# Patient Record
Sex: Female | Born: 1970 | Hispanic: Yes | Marital: Married | Smoking: Never smoker
Health system: Southern US, Community
[De-identification: ages and names within clinical notes are randomized; demographics above are authoritative.]

## PROBLEM LIST (undated history)

## (undated) DIAGNOSIS — M5416 Radiculopathy, lumbar region: Secondary | ICD-10-CM

## (undated) DIAGNOSIS — J45909 Unspecified asthma, uncomplicated: Secondary | ICD-10-CM

## (undated) DIAGNOSIS — N63 Unspecified lump in unspecified breast: Secondary | ICD-10-CM

## (undated) DIAGNOSIS — G5603 Carpal tunnel syndrome, bilateral upper limbs: Secondary | ICD-10-CM

## (undated) DIAGNOSIS — G549 Nerve root and plexus disorder, unspecified: Secondary | ICD-10-CM

## (undated) DIAGNOSIS — K219 Gastro-esophageal reflux disease without esophagitis: Secondary | ICD-10-CM

## (undated) DIAGNOSIS — J302 Other seasonal allergic rhinitis: Secondary | ICD-10-CM

## (undated) HISTORY — DX: Other seasonal allergic rhinitis: J30.2

---

## 2005-06-08 ENCOUNTER — Inpatient Hospital Stay: Payer: Self-pay | Admitting: Obstetrics and Gynecology

## 2010-03-02 ENCOUNTER — Ambulatory Visit: Payer: Self-pay | Admitting: Advanced Practice Midwife

## 2010-03-12 ENCOUNTER — Encounter: Payer: Self-pay | Admitting: Maternal and Fetal Medicine

## 2010-04-16 ENCOUNTER — Encounter: Payer: Self-pay | Admitting: Maternal & Fetal Medicine

## 2010-09-17 ENCOUNTER — Inpatient Hospital Stay: Payer: Self-pay | Admitting: Obstetrics and Gynecology

## 2019-03-06 ENCOUNTER — Other Ambulatory Visit: Payer: Self-pay

## 2019-03-06 ENCOUNTER — Ambulatory Visit
Admission: RE | Admit: 2019-03-06 | Discharge: 2019-03-06 | Disposition: A | Discharge status: Home / Self Care | Payer: Self-pay | Source: Ambulatory Visit | Attending: Oncology | Admitting: Oncology

## 2019-03-06 ENCOUNTER — Telehealth: Payer: Self-pay

## 2019-03-06 ENCOUNTER — Ambulatory Visit: Payer: Self-pay | Attending: Oncology

## 2019-03-06 ENCOUNTER — Encounter (INDEPENDENT_AMBULATORY_CARE_PROVIDER_SITE_OTHER): Payer: Self-pay

## 2019-03-06 VITALS — BP 124/82 | HR 96 | Temp 96.3°F | Ht 64.0 in | Wt 224.0 lb

## 2019-03-06 DIAGNOSIS — N63 Unspecified lump in unspecified breast: Secondary | ICD-10-CM

## 2019-03-06 NOTE — Progress Notes (Addendum)
  Subjective:     Patient ID: Melody Norris, female   DOB: 12-Sep-1970, 48 y.o.   MRN: [id number]  HPI   Review of Systems     Objective:   Physical Exam Chest:     Breasts:        Right: Tenderness present. No swelling, bleeding, inverted nipple, mass, nipple discharge or skin change.        Left: Mass and tenderness present.       Comments: Small cyst- like nodule left breast 9:30       Assessment:     48 year old hispanic patient presents with complaint of left breast mass x 3 years, recently becoming tender.  No prevoius mammogram.  Adin Hector interpreted exam. Patient screened, and meets BCCCP eligibility.  Patient does not have insurance, Medicare or Medicaid. Instructed patient on breast self awareness using teach back method.  Clinical breast exam reveals a small cystlike tender nodularity at 9:30 left breast.  Patient also reports bilateral npple tenderness, and hot flashes.  Explained she nmay be experiencing perimenopausal symtoms.  Risk Assessment    Risk Scores      03/06/2019   Last edited by: Rico Junker, RN   5-year risk: 0.4 %   Lifetime risk: 4.9 %            Plan:     Sent for bilateral disgnostic mammogram, and ultrasound.

## 2019-03-06 NOTE — Telephone Encounter (Signed)
TC from [place] at [place]. Recent pap records and last visit faxed to cancer center.  Patient has appt today. Aileen Fass, RN

## 2019-03-07 ENCOUNTER — Ambulatory Visit: Payer: Self-pay | Attending: Oncology

## 2019-03-07 ENCOUNTER — Other Ambulatory Visit: Payer: Self-pay

## 2019-03-07 DIAGNOSIS — Z0000 Encounter for general adult medical examination without abnormal findings: Secondary | ICD-10-CM

## 2019-03-07 DIAGNOSIS — N63 Unspecified lump in unspecified breast: Secondary | ICD-10-CM

## 2019-03-07 NOTE — Progress Notes (Signed)
  Subjective:     Patient ID: Melody Norris, female   DOB: 1971/05/16, 48 y.o.   MRN: [id number]  HPI   Review of Systems     Objective:   Physical Exam Genitourinary:    Labia:        Right: No rash, tenderness, lesion or injury.        Left: No rash, tenderness, lesion or injury.      Cervix: No cervical motion tenderness, discharge, friability, lesion, erythema, cervical bleeding or eversion.     Uterus: Not deviated, not enlarged, not fixed, not tender and no uterine prolapse.      Adnexa:        Right: No mass, tenderness or fullness.         Left: No mass, tenderness or fullness.          Assessment:  Returned for pap. Pelvic exam normal.  Mammogram from 03/06/19 [facility] results.  Right breast biopsy scheduled for 03/09/19 at 8:30.  Given appointment reminder .  Jaqui Laukaitis interpreted exam.    Plan:     Specimen collected for pap/

## 2019-03-07 NOTE — Progress Notes (Signed)
[facility] 4 mammogram results with biopsy of left breast recommendation.  Patient is aware.

## 2019-03-07 NOTE — Progress Notes (Signed)
Patient returned for pap.

## 2019-03-09 ENCOUNTER — Other Ambulatory Visit: Payer: Self-pay

## 2019-03-09 ENCOUNTER — Ambulatory Visit
Admission: RE | Admit: 2019-03-09 | Discharge: 2019-03-09 | Disposition: A | Discharge status: Home / Self Care | Payer: Self-pay | Source: Ambulatory Visit | Attending: Oncology | Admitting: Oncology

## 2019-03-09 DIAGNOSIS — N63 Unspecified lump in unspecified breast: Secondary | ICD-10-CM | POA: Insufficient documentation

## 2019-03-09 HISTORY — PX: BREAST BIOPSY: SHX20

## 2019-03-13 LAB — SURGICAL PATHOLOGY

## 2019-03-14 NOTE — Progress Notes (Signed)
Patient ID: Melody Norris, female   DOB: 1971-08-07, 48 y.o.   MRN: [id number] Phoned patient to relay appointment information with Dr. Hampton Abbot. Patient requested change to 04/03/19 at 9:00.  Mailed reminder.  JAqui Lauakitis interpreted call.

## 2019-03-16 LAB — PAP LB AND HPV HIGH-RISK: HPV, high-risk: NEGATIVE

## 2019-03-27 ENCOUNTER — Ambulatory Visit: Payer: Self-pay | Admitting: Surgery

## 2019-04-03 ENCOUNTER — Ambulatory Visit (INDEPENDENT_AMBULATORY_CARE_PROVIDER_SITE_OTHER): Payer: Self-pay | Admitting: Surgery

## 2019-04-03 ENCOUNTER — Encounter: Payer: Self-pay | Admitting: Surgery

## 2019-04-03 ENCOUNTER — Other Ambulatory Visit: Payer: Self-pay

## 2019-04-03 VITALS — BP 128/85 | HR 77 | Temp 97.7°F | Wt 226.0 lb

## 2019-04-03 DIAGNOSIS — D239 Other benign neoplasm of skin, unspecified: Secondary | ICD-10-CM

## 2019-04-03 DIAGNOSIS — N6320 Unspecified lump in the left breast, unspecified quadrant: Secondary | ICD-10-CM

## 2019-04-03 NOTE — Patient Instructions (Addendum)
Patient's surgery to be scheduled for 04/11/19 at [facility] with Dr. Hampton Abbot.  The patient is aware to have COVID-19 testing done on 04/06/19 at the [place] building drive thru ([address] [place]) between 8:00 am and 10:30 am. She is aware to isolate after, have no visitors, wash hands frequently, and avoid touching face.   The patient is aware she will need to Pre-Admit. Patient will check in at the [place] entrance due to COVID-19 restrictions and will then be escorted to the [place], [address] (first floor). Patient will be contacted once Pre-admission appointment has been arranged with date and time.   Patient aware to be NPO after midnight and have a driver.   She is aware to check in at the [place] entrance where she will be screened for the coronavirus and then sent to Same Day Surgery.   Patient aware that she may have one visitor due to COVID-19 restrictions.   The patient verbalizes understanding of the above.   The patient is aware to call the office should she have further questions.

## 2019-04-03 NOTE — Progress Notes (Signed)
04/03/2019  Reason for Visit:  Left breast mass  Referring Provider:  Al Pimple, RN  History of Present Illness: Melody Norris is a 48 y.o. female presenting for evaluation of a left breast mass.  The patient reports feeling a small mass for about 2 years on her left breast, but over the past few months has become more sensitive and causes her some pain.  She feels like it is a small ball, but it's not superficial on the skin.  Denies any redness, induration, or drainage from the area.  Reports she had a biopsy and that was very tender.  She does not do self breast exams.  Her menstrual cycle started at age 71, first preganacy at 68, and total of 5 pregnancies.  No history of breast cancer in her or in family.  Past Medical History: No past medical history on file.   Past Surgical History: None  Home Medications: Prior to Admission medications   Not on File    Allergies: No Known Allergies  Social History:  reports that she has never smoked. She has never used smokeless tobacco. She reports that she does not drink alcohol or use drugs.   Family History: No family history of breast cancer.  Review of Systems: Review of Systems  Constitutional: Negative for chills and fever.  Respiratory: Negative for shortness of breath.   Cardiovascular: Negative for chest pain.  Gastrointestinal: Negative for abdominal pain, nausea and vomiting.  Genitourinary: Negative for dysuria.  Musculoskeletal: Negative for myalgias.  Skin: Negative for rash.  Neurological: Negative for dizziness.  Psychiatric/Behavioral: Negative for depression.    Physical Exam BP 128/85   Pulse 77   Temp 97.7 F (36.5 C)   Wt 226 lb (102.5 kg)   SpO2 96%   BMI 38.79 kg/m  CONSTITUTIONAL: No acute distress HEENT:  Normocephalic, atraumatic, extraocular motion intact. NECK: Trachea is midline, and there is no jugular venous distension.  RESPIRATORY:  Lungs are clear, and breath sounds are equal  bilaterally. Normal respiratory effort without pathologic use of accessory muscles. CARDIOVASCULAR: Heart is regular without murmurs, gallops, or rubs. BREAST:  On left breast exam, there is small 0.5 cm palpable area at 9:30-10 o' clock position, about 10-11 cm from nipple, consistent with area where patient reports her pain.  It is superficial.  However, her imaging reports mention this mass being at 11 o'clock.  No palpable masses in that area and is not the area of tenderness for her.  No nipple changes, drainage, retraction.  No left axillary lymphadenopathy.  Right breast and axilla exam negative. GI: The abdomen is soft, obese, non-distended, non-tender.  MUSCULOSKELETAL:  Normal muscle strength and tone in all four extremities.  No peripheral edema or cyanosis. SKIN: Skin turgor is normal. There are no pathologic skin lesions.  NEUROLOGIC:  Motor and sensation is grossly normal.  Cranial nerves are grossly intact. PSYCH:  Alert and oriented to person, place and time. Affect is normal.  Laboratory Analysis: DIAGNOSIS:  A. LEFT BREAST, 11:00; ULTRASOUND-GUIDED NEEDLE CORE BIOPSY:  - FAVOR BENIGN ADNEXAL TUMOR; SEE COMMENT.  - EXCISION IS RECOMMENDED.   Comment:  Sections demonstrate a well-circumscribed lesion characterized by  islands and lobules of basaloid cells. Cells at the periphery of the  islands are smaller, while the central cells are larger and have  vesicular nuclei. Many of the islands contain dense eosinophilic material. Diagnostic considerations include (but are not limited to) cylindroma and eccrine spiradenoma. The case was reviewed intradepartmentally and  the consensus opinion is reflected above. Complete excision is recommended for a definitive characterization of this lesion.   Imaging: Mammogram and U/S 03/06/19: FINDINGS: Metallic skin marker was placed on the far upper left breast in the region of concern. Spot tangential view of the area shows a lobulated oval  circumscribed superficial mass. Mass measures approximately 1 cm. No additional mass is identified in the left breast. No architectural distortion or suspicious microcalcification in the left breast.  No findings to suggest malignancy in the right breast.  Mammographic images were processed with CAD.  On physical exam, there is a subtle palpable superficial lump in the left breast approximately 11 o'clock position 10-11 cm from the nipple. There is no erythema. No visible prominent skin pore.  Targeted ultrasound is performed, showing a mixed echogenicity but predominantly hypoechoic oval lobulated mass centered within the superficial breast fat lobule, in total measuring 0.9 x 0.4 x 0.7 cm. Focally, the superficial aspect of this mass extends into the dermis. There is definite color Doppler flow within the mass.  Ultrasound of the left axilla is negative for lymphadenopathy.  IMPRESSION: Palpable superficial left breast mass approximately 11 position 11 cm from the nipple. Ultrasound findings are nonspecific. Malignancy cannot be completely excluded. Other considerations could include atypical sebaceous cyst, hemangioma or other vascular malformation, or atypical lymph node.  No evidence of malignancy in the right breast.  Assessment and Plan: This is a 48 y.o. female with newly diagnosed left breast mass.  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  Discussed with the patient that this is overall a benign mass but that based on research, excision is recommended.  This is the only mass that she has felt and noticed and at this point I have less suspicion for diffuse masses that represent a genetic syndrome.    Discussed with radiology at [facility] today and it is felt that likely the mass was truly at 9:30 as one of the images says in her original imaging test.  However, further images and her biopsy images show 11 position.  For surgery purposes, we discussed  that she would need new clip marker placement and either skin marker or wire localization so I can identify the true mass location during surgery.  Discussed with the patient that we can proceed with excision of the breast mass.  This would be an outpatient procedure.  Discusses risks of bleeding, infection, injury to surrounding structures.  She is willing to proceed.  We will schedule her for 8/19 PM.  She understands she would need pre-admit testing and COVID-19 testing.  Face-to-face time spent with the patient and care providers was 60 minutes, with more than 50% of the time spent counseling, educating, and coordinating care of the patient.     [name], [facility]

## 2019-04-03 NOTE — H&P (View-Only) (Signed)
04/03/2019  Reason for Visit:  Left breast mass  Referring Provider:  Al Pimple, RN  History of Present Illness: Melody Norris is a 48 y.o. female presenting for evaluation of a left breast mass.  The patient reports feeling a small mass for about 2 years on her left breast, but over the past few months has become more sensitive and causes her some pain.  She feels like it is a small ball, but it's not superficial on the skin.  Denies any redness, induration, or drainage from the area.  Reports she had a biopsy and that was very tender.  She does not do self breast exams.  Her menstrual cycle started at age 70, first preganacy at 89, and total of 5 pregnancies.  No history of breast cancer in her or in family.  Past Medical History: No past medical history on file.   Past Surgical History: None  Home Medications: Prior to Admission medications   Not on File    Allergies: No Known Allergies  Social History:  reports that she has never smoked. She has never used smokeless tobacco. She reports that she does not drink alcohol or use drugs.   Family History: No family history of breast cancer.  Review of Systems: Review of Systems  Constitutional: Negative for chills and fever.  Respiratory: Negative for shortness of breath.   Cardiovascular: Negative for chest pain.  Gastrointestinal: Negative for abdominal pain, nausea and vomiting.  Genitourinary: Negative for dysuria.  Musculoskeletal: Negative for myalgias.  Skin: Negative for rash.  Neurological: Negative for dizziness.  Psychiatric/Behavioral: Negative for depression.    Physical Exam BP 128/85   Pulse 77   Temp 97.7 F (36.5 C)   Wt 226 lb (102.5 kg)   SpO2 96%   BMI 38.79 kg/m  CONSTITUTIONAL: No acute distress HEENT:  Normocephalic, atraumatic, extraocular motion intact. NECK: Trachea is midline, and there is no jugular venous distension.  RESPIRATORY:  Lungs are clear, and breath sounds are equal  bilaterally. Normal respiratory effort without pathologic use of accessory muscles. CARDIOVASCULAR: Heart is regular without murmurs, gallops, or rubs. BREAST:  On left breast exam, there is small 0.5 cm palpable area at 9:30-10 o' clock position, about 10-11 cm from nipple, consistent with area where patient reports her pain.  It is superficial.  However, her imaging reports mention this mass being at 11 o'clock.  No palpable masses in that area and is not the area of tenderness for her.  No nipple changes, drainage, retraction.  No left axillary lymphadenopathy.  Right breast and axilla exam negative. GI: The abdomen is soft, obese, non-distended, non-tender.  MUSCULOSKELETAL:  Normal muscle strength and tone in all four extremities.  No peripheral edema or cyanosis. SKIN: Skin turgor is normal. There are no pathologic skin lesions.  NEUROLOGIC:  Motor and sensation is grossly normal.  Cranial nerves are grossly intact. PSYCH:  Alert and oriented to person, place and time. Affect is normal.  Laboratory Analysis: DIAGNOSIS:  A. LEFT BREAST, 11:00; ULTRASOUND-GUIDED NEEDLE CORE BIOPSY:  - FAVOR BENIGN ADNEXAL TUMOR; SEE COMMENT.  - EXCISION IS RECOMMENDED.   Comment:  Sections demonstrate a well-circumscribed lesion characterized by  islands and lobules of basaloid cells. Cells at the periphery of the  islands are smaller, while the central cells are larger and have  vesicular nuclei. Many of the islands contain dense eosinophilic material. Diagnostic considerations include (but are not limited to) cylindroma and eccrine spiradenoma. The case was reviewed intradepartmentally and  the consensus opinion is reflected above. Complete excision is recommended for a definitive characterization of this lesion.   Imaging: Mammogram and U/S 03/06/19: FINDINGS: Metallic skin marker was placed on the far upper left breast in the region of concern. Spot tangential view of the area shows a lobulated oval  circumscribed superficial mass. Mass measures approximately 1 cm. No additional mass is identified in the left breast. No architectural distortion or suspicious microcalcification in the left breast.  No findings to suggest malignancy in the right breast.  Mammographic images were processed with CAD.  On physical exam, there is a subtle palpable superficial lump in the left breast approximately 11 o'clock position 10-11 cm from the nipple. There is no erythema. No visible prominent skin pore.  Targeted ultrasound is performed, showing a mixed echogenicity but predominantly hypoechoic oval lobulated mass centered within the superficial breast fat lobule, in total measuring 0.9 x 0.4 x 0.7 cm. Focally, the superficial aspect of this mass extends into the dermis. There is definite color Doppler flow within the mass.  Ultrasound of the left axilla is negative for lymphadenopathy.  IMPRESSION: Palpable superficial left breast mass approximately 11 position 11 cm from the nipple. Ultrasound findings are nonspecific. Malignancy cannot be completely excluded. Other considerations could include atypical sebaceous cyst, hemangioma or other vascular malformation, or atypical lymph node.  No evidence of malignancy in the right breast.  Assessment and Plan: This is a 48 y.o. female with newly diagnosed left breast mass.  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  Discussed with the patient that this is overall a benign mass but that based on research, excision is recommended.  This is the only mass that she has felt and noticed and at this point I have less suspicion for diffuse masses that represent a genetic syndrome.    Discussed with radiology at [facility] today and it is felt that likely the mass was truly at 9:30 as one of the images says in her original imaging test.  However, further images and her biopsy images show 11 position.  For surgery purposes, we discussed  that she would need new clip marker placement and either skin marker or wire localization so I can identify the true mass location during surgery.  Discussed with the patient that we can proceed with excision of the breast mass.  This would be an outpatient procedure.  Discusses risks of bleeding, infection, injury to surrounding structures.  She is willing to proceed.  We will schedule her for 8/19 PM.  She understands she would need pre-admit testing and COVID-19 testing.  Face-to-face time spent with the patient and care providers was 60 minutes, with more than 50% of the time spent counseling, educating, and coordinating care of the patient.     [name], [facility]

## 2019-04-04 ENCOUNTER — Telehealth: Payer: Self-pay

## 2019-04-04 ENCOUNTER — Other Ambulatory Visit: Payer: Self-pay | Admitting: Surgery

## 2019-04-04 DIAGNOSIS — N6320 Unspecified lump in the left breast, unspecified quadrant: Secondary | ICD-10-CM

## 2019-04-04 DIAGNOSIS — D239 Other benign neoplasm of skin, unspecified: Secondary | ICD-10-CM

## 2019-04-04 NOTE — Telephone Encounter (Signed)
Called and spoke with the patient's husband who speaks english. I informed him that the patient was scheduled for Pre Admit testing on 04/09/19 at 9:30 am, she will go in through the [place] entrance. She would still do her Covid-19 test on 04/06/19 at the [place] up testing site. She will arrive at the hospital the day of her surgery on 04/11/19 at 8:30 am and go in through the [facility] entrance. He will make her aware of dates, times, and instructions.

## 2019-04-06 ENCOUNTER — Other Ambulatory Visit
Admission: RE | Admit: 2019-04-06 | Discharge: 2019-04-06 | Disposition: A | Discharge status: Home / Self Care | Payer: HRSA Program | Source: Ambulatory Visit | Attending: Surgery | Admitting: Surgery

## 2019-04-06 ENCOUNTER — Other Ambulatory Visit: Payer: Self-pay

## 2019-04-06 DIAGNOSIS — Z20828 Contact with and (suspected) exposure to other viral communicable diseases: Secondary | ICD-10-CM | POA: Insufficient documentation

## 2019-04-06 DIAGNOSIS — Z01812 Encounter for preprocedural laboratory examination: Secondary | ICD-10-CM | POA: Diagnosis not present

## 2019-04-06 LAB — SARS CORONAVIRUS 2 (TAT 6-24 HRS): SARS Coronavirus 2: NEGATIVE

## 2019-04-09 ENCOUNTER — Encounter
Admission: RE | Admit: 2019-04-09 | Discharge: 2019-04-09 | Disposition: A | Discharge status: Home / Self Care | Payer: Self-pay | Source: Ambulatory Visit | Attending: Surgery | Admitting: Surgery

## 2019-04-09 ENCOUNTER — Other Ambulatory Visit: Payer: Self-pay

## 2019-04-09 DIAGNOSIS — N6320 Unspecified lump in the left breast, unspecified quadrant: Secondary | ICD-10-CM

## 2019-04-09 DIAGNOSIS — Z01812 Encounter for preprocedural laboratory examination: Secondary | ICD-10-CM | POA: Insufficient documentation

## 2019-04-09 HISTORY — DX: Gastro-esophageal reflux disease without esophagitis: K21.9

## 2019-04-09 HISTORY — DX: Unspecified lump in unspecified breast: N63.0

## 2019-04-09 HISTORY — DX: Unspecified asthma, uncomplicated: J45.909

## 2019-04-09 LAB — CBC
HCT: 42.4 % (ref 36.0–46.0)
Hemoglobin: 14 g/dL (ref 12.0–15.0)
MCH: 30 pg (ref 26.0–34.0)
MCHC: 33 g/dL (ref 30.0–36.0)
MCV: 90.8 fL (ref 80.0–100.0)
Platelets: 334 10*3/uL (ref 150–400)
RBC: 4.67 MIL/uL (ref 3.87–5.11)
RDW: 12.3 % (ref 11.5–15.5)
WBC: 10.4 10*3/uL (ref 4.0–10.5)
nRBC: 0 % (ref 0.0–0.2)

## 2019-04-09 MED ORDER — CHLORHEXIDINE GLUCONATE CLOTH 2 % EX PADS
6.0000 | MEDICATED_PAD | Freq: Once | CUTANEOUS | Status: DC
  Filled 2019-04-09: qty 6

## 2019-04-09 NOTE — Patient Instructions (Addendum)
Instrucciones Para Antes de la Ciruga   Su ciruga est programada para-(your procedure is scheduled on) 04/11/2019 Wed   mammography    Por favor llame al [phone] despus de las 12 del medioda del da anterior a su procedimiento para preguntar sobre su hora de llegada.  (Please call [phone] after 12 noon the day before your procedure to receive your arrival time.)                  Recuerde: (Remember)   No coma alimentos ni tome lquidos, incluyendo agua, despus de la medianoche del  (Do not eat food or drink liquids including water after midnight on_______________   Melody Norris medicinas en la manaa de la ciruga con un SORBITO de agua (take these meds the morning of surgery with a SIP of water)_none use inhaler if needed and bring to hospital with you.  _______________________________   Lavena Norris los dientes en la maana de la [place]. (you may brush your teeth the morning of surgery)   No use joyas, maquillaje de ojos, lpiz labial, crema para el cuerpo o esmalte de uas oscuro.  (Do not wear jewelry, eye makeup, lipstick, body lotion, or dark fingernail polish)   No puede usar desodorante. (you may wear deodorant)   Si va a ser ingresado despus de la ciruga, deje la maleta en el carro hasta que se le haya asignado una habitacin. (If you are to be admitted after surgery, leave suitcase in car until your room has been assigned.)   A los pacientes que se les d de alta el mismo da no se les permitir manejar a casa.  (Patients discharged on the day of surgery will not be allowed to drive home)   Use ropa suelta y cmoda de regreso a casa. (wear loose comfortable clothes for ride home)    Melody Norris (patient signature) ______________________________________       Your procedure is scheduled on: 04/11/2019 Wed Report to Mammography Remember: Instructions that are not followed completely may result in serious medical risk, up to  and including death, or upon the discretion of your surgeon and anesthesiologist your surgery may need to be rescheduled.    _x___ 1. Do not eat food after midnight the night before your procedure. You may drink clear liquids up to 2 hours before you are scheduled to arrive at the hospital for your procedure.  Do not drink clear liquids within 2 hours of your scheduled arrival to the hospital.  Clear liquids include  --Water or Apple juice without pulp  --Clear carbohydrate beverage such as ClearFast or Gatorade  --Black Coffee or Clear Tea (No milk, no creamers, do not add anything to                  the coffee or Tea Type 1 and type 2 diabetics should only drink water.   ____Ensure clear carbohydrate drink on the way to the hospital for bariatric patients  ____Ensure clear carbohydrate drink 3 hours before surgery.   No gum chewing or hard candies.     __x__ 2. No Alcohol for 24 hours before or after surgery.   __x__3. No Smoking or e-cigarettes for 24 prior to surgery.  Do not use any chewable tobacco products for at least 6 hour prior to surgery   ____  4. Bring all medications with you on the day of surgery if instructed.    __x__ 5. Notify your doctor if there is any change in your  medical condition     (cold, fever, infections).    x___6. On the morning of surgery brush your teeth with toothpaste and water.  You may rinse your mouth with mouth wash if you wish.  Do not swallow any toothpaste or mouthwash.   Do not wear jewelry, make-up, hairpins, clips or nail polish.  Do not wear lotions, powders, or perfumes. You may wear deodorant.  Do not shave 48 hours prior to surgery. Men may shave face and neck.  Do not bring valuables to the hospital.    [facility] is not responsible for any belongings or valuables.               Contacts, dentures or bridgework may not be worn into surgery.  Leave your suitcase in the car. After surgery it may be brought to your room.  For  patients admitted to the hospital, discharge time is determined by your                       treatment team.  _  Patients discharged the day of surgery will not be allowed to drive home.  You will need someone to drive you home and stay with you the night of your procedure.    Please read over the following fact sheets that you were given:   [facility] Preparing for Surgery and or MRSA Information   _x___ Take anti-hypertensive listed below, cardiac, seizure, asthma,     anti-reflux and psychiatric medicines. These include:  1. None  2.  3.  4.  5.  6.  ____Fleets enema or Magnesium Citrate as directed.   _x___ Use CHG Soap or sage wipes as directed on instruction sheet   __x_ Use inhalers on the day of surgery if needed and bring to hospital day of surgery  ____ Stop Metformin and Janumet 2 days prior to surgery.    ____ Take 1/2 of usual insulin dose the night before surgery and none on the morning     surgery.   _x___ Follow recommendations from Cardiologist, Pulmonologist or PCP regarding          stopping Aspirin, Coumadin, Plavix ,Eliquis, Effient, or Pradaxa, and Pletal.  X____Stop Anti-inflammatories such as Advil, Aleve, Ibuprofen, Motrin, Naproxen, Naprosyn, Goodies powders or aspirin products. OK to take Tylenol and                          Celebrex.   _x___ Stop supplements until after surgery.  But may continue Vitamin D, Vitamin B,       and multivitamin.   ____ Bring C-Pap to the hospital.

## 2019-04-11 ENCOUNTER — Other Ambulatory Visit: Payer: Self-pay | Admitting: Surgery

## 2019-04-11 ENCOUNTER — Encounter: Payer: Self-pay | Admitting: Anesthesiology

## 2019-04-11 ENCOUNTER — Other Ambulatory Visit: Payer: Self-pay

## 2019-04-11 ENCOUNTER — Ambulatory Visit: Payer: Self-pay | Admitting: Certified Registered"

## 2019-04-11 ENCOUNTER — Ambulatory Visit
Admission: RE | Admit: 2019-04-11 | Discharge: 2019-04-11 | Disposition: A | Discharge status: Home / Self Care | Payer: Self-pay | Source: Ambulatory Visit | Attending: Surgery | Admitting: Surgery

## 2019-04-11 ENCOUNTER — Encounter
Admission: RE | Disposition: A | Discharge status: Home / Self Care | Payer: Self-pay | Source: Home / Self Care | Attending: Surgery

## 2019-04-11 ENCOUNTER — Ambulatory Visit
Admission: RE | Admit: 2019-04-11 | Discharge: 2019-04-11 | Disposition: A | Discharge status: Home / Self Care | Payer: Self-pay | Attending: Surgery | Admitting: Surgery

## 2019-04-11 DIAGNOSIS — N6320 Unspecified lump in the left breast, unspecified quadrant: Secondary | ICD-10-CM

## 2019-04-11 DIAGNOSIS — K219 Gastro-esophageal reflux disease without esophagitis: Secondary | ICD-10-CM | POA: Insufficient documentation

## 2019-04-11 DIAGNOSIS — D239 Other benign neoplasm of skin, unspecified: Secondary | ICD-10-CM

## 2019-04-11 DIAGNOSIS — J45909 Unspecified asthma, uncomplicated: Secondary | ICD-10-CM | POA: Insufficient documentation

## 2019-04-11 HISTORY — PX: BREAST LUMPECTOMY WITH RADIOACTIVE SEED LOCALIZATION: SHX6424

## 2019-04-11 LAB — POCT PREGNANCY, URINE: Preg Test, Ur: NEGATIVE

## 2019-04-11 SURGERY — BREAST LUMPECTOMY WITH RADIOACTIVE SEED LOCALIZATION
Anesthesia: General | Laterality: Left

## 2019-04-11 MED ORDER — OXYCODONE HCL 5 MG PO TABS: 5.0000 mg | ORAL_TABLET | ORAL | 0 refills | Status: DC | PRN

## 2019-04-11 MED ORDER — ACETAMINOPHEN 500 MG PO TABS
1000.0000 mg | ORAL_TABLET | ORAL | Status: AC
  Administered 2019-04-11: 10:00:00 1000 mg via ORAL

## 2019-04-11 MED ORDER — PROPOFOL 10 MG/ML IV BOLUS
INTRAVENOUS | Status: DC | PRN
  Administered 2019-04-11: 100 mg via INTRAVENOUS
  Administered 2019-04-11: 50 mg via INTRAVENOUS

## 2019-04-11 MED ORDER — DEXAMETHASONE SODIUM PHOSPHATE 10 MG/ML IJ SOLN
INTRAMUSCULAR | Status: DC | PRN
  Administered 2019-04-11: 10 mg via INTRAVENOUS

## 2019-04-11 MED ORDER — GABAPENTIN 300 MG PO CAPS
300.0000 mg | ORAL_CAPSULE | ORAL | Status: AC
  Administered 2019-04-11: 300 mg via ORAL

## 2019-04-11 MED ORDER — FENTANYL CITRATE (PF) 100 MCG/2ML IJ SOLN
INTRAMUSCULAR | Status: DC | PRN
  Administered 2019-04-11 (×2): 25 ug via INTRAVENOUS
  Administered 2019-04-11: 50 ug via INTRAVENOUS

## 2019-04-11 MED ORDER — BUPIVACAINE-EPINEPHRINE 0.5% -1:200000 IJ SOLN
INTRAMUSCULAR | Status: DC | PRN
  Administered 2019-04-11: 20 mL

## 2019-04-11 MED ORDER — METHYLENE BLUE 0.5 % INJ SOLN
INTRAVENOUS | Status: AC
  Filled 2019-04-11: qty 10

## 2019-04-11 MED ORDER — ACETAMINOPHEN 10 MG/ML IV SOLN
INTRAVENOUS | Status: AC
  Filled 2019-04-11: qty 100

## 2019-04-11 MED ORDER — FAMOTIDINE 20 MG PO TABS
20.0000 mg | ORAL_TABLET | Freq: Once | ORAL | Status: AC
  Administered 2019-04-11: 10:00:00 20 mg via ORAL

## 2019-04-11 MED ORDER — OXYCODONE HCL 5 MG PO TABS: 5.0000 mg | ORAL_TABLET | Freq: Once | ORAL | Status: DC | PRN

## 2019-04-11 MED ORDER — OXYCODONE HCL 5 MG/5ML PO SOLN: 5.0000 mg | Freq: Once | ORAL | Status: DC | PRN

## 2019-04-11 MED ORDER — BUPIVACAINE-EPINEPHRINE (PF) 0.5% -1:200000 IJ SOLN
INTRAMUSCULAR | Status: AC
  Filled 2019-04-11: qty 30

## 2019-04-11 MED ORDER — CEFAZOLIN SODIUM-DEXTROSE 2-4 GM/100ML-% IV SOLN
2.0000 g | INTRAVENOUS | Status: AC
  Administered 2019-04-11: 2 g via INTRAVENOUS

## 2019-04-11 MED ORDER — ACETAMINOPHEN 10 MG/ML IV SOLN
INTRAVENOUS | Status: DC | PRN
  Administered 2019-04-11: 1000 mg via INTRAVENOUS

## 2019-04-11 MED ORDER — IBUPROFEN 600 MG PO TABS: 600.0000 mg | ORAL_TABLET | Freq: Three times a day (TID) | ORAL | 0 refills | Status: DC | PRN

## 2019-04-11 MED ORDER — FENTANYL CITRATE (PF) 100 MCG/2ML IJ SOLN: 25.0000 ug | INTRAMUSCULAR | Status: DC | PRN

## 2019-04-11 MED ORDER — ONDANSETRON HCL 4 MG/2ML IJ SOLN
INTRAMUSCULAR | Status: DC | PRN
  Administered 2019-04-11: 4 mg via INTRAVENOUS

## 2019-04-11 MED ORDER — MIDAZOLAM HCL 2 MG/2ML IJ SOLN
INTRAMUSCULAR | Status: DC | PRN
  Administered 2019-04-11: 2 mg via INTRAVENOUS

## 2019-04-11 MED ORDER — LIDOCAINE HCL (CARDIAC) PF 100 MG/5ML IV SOSY
PREFILLED_SYRINGE | INTRAVENOUS | Status: DC | PRN
  Administered 2019-04-11: 100 mg via INTRAVENOUS

## 2019-04-11 MED ORDER — SODIUM CHLORIDE FLUSH 0.9 % IV SOLN
INTRAVENOUS | Status: AC
  Filled 2019-04-11: qty 10

## 2019-04-11 MED ORDER — GLYCOPYRROLATE 0.2 MG/ML IJ SOLN
INTRAMUSCULAR | Status: DC | PRN
  Administered 2019-04-11: 0.2 mg via INTRAVENOUS

## 2019-04-11 MED ORDER — LACTATED RINGERS IV SOLN
INTRAVENOUS | Status: DC
  Administered 2019-04-11: 17:00:00 via INTRAVENOUS

## 2019-04-11 SURGICAL SUPPLY — 45 items
APPLIER CLIP 9.375 SM OPEN (CLIP)
BINDER BREAST LRG (GAUZE/BANDAGES/DRESSINGS) IMPLANT
BINDER BREAST MEDIUM (GAUZE/BANDAGES/DRESSINGS) IMPLANT
BLADE PHOTON ILLUMINATED (MISCELLANEOUS) IMPLANT
BLADE SURG 15 STRL LF DISP TIS (BLADE) ×2 IMPLANT
BLADE SURG 15 STRL SS (BLADE) ×4
CANISTER SUCT 1200ML W/VALVE (MISCELLANEOUS) ×3 IMPLANT
CHLORAPREP W/TINT 26 (MISCELLANEOUS) ×3 IMPLANT
CLIP APPLIE 9.375 SM OPEN (CLIP) IMPLANT
CNTNR SPEC 2.5X3XGRAD LEK (MISCELLANEOUS) ×1
CONT SPEC 4OZ STER OR WHT (MISCELLANEOUS) ×2
CONTAINER SPEC 2.5X3XGRAD LEK (MISCELLANEOUS) ×1 IMPLANT
COVER PROBE FLX POLY STRL (MISCELLANEOUS) ×3 IMPLANT
COVER WAND RF STERILE (DRAPES) IMPLANT
DERMABOND ADVANCED (GAUZE/BANDAGES/DRESSINGS) ×2
DERMABOND ADVANCED .7 DNX12 (GAUZE/BANDAGES/DRESSINGS) ×1 IMPLANT
DEVICE DUBIN SPECIMEN MAMMOGRA (MISCELLANEOUS) ×3 IMPLANT
DRAPE LAPAROTOMY 100X77 ABD (DRAPES) ×3 IMPLANT
DRSG GAUZE FLUFF 36X18 (GAUZE/BANDAGES/DRESSINGS) ×3 IMPLANT
ELECT CAUTERY BLADE 6.4 (BLADE) ×3 IMPLANT
ELECT REM PT RETURN 9FT ADLT (ELECTROSURGICAL) ×3
ELECTRODE REM PT RTRN 9FT ADLT (ELECTROSURGICAL) ×1 IMPLANT
GLOVE SURG SYN 7.0 (GLOVE) ×3 IMPLANT
GLOVE SURG SYN 7.5  E (GLOVE) ×2
GLOVE SURG SYN 7.5 E (GLOVE) ×1 IMPLANT
GOWN STRL REUS W/ TWL LRG LVL3 (GOWN DISPOSABLE) ×2 IMPLANT
GOWN STRL REUS W/TWL LRG LVL3 (GOWN DISPOSABLE) ×4
KIT TURNOVER KIT A (KITS) ×3 IMPLANT
LABEL OR SOLS (LABEL) ×3 IMPLANT
NEEDLE HYPO 22GX1.5 SAFETY (NEEDLE) ×3 IMPLANT
NEEDLE HYPO 25X1 1.5 SAFETY (NEEDLE) ×3 IMPLANT
PACK BASIN MINOR ARMC (MISCELLANEOUS) ×3 IMPLANT
SLEVE PROBE SENORX GAMMA FIND (MISCELLANEOUS) IMPLANT
SUT ETHILON 3-0 FS-10 30 BLK (SUTURE) ×3
SUT MNCRL 4-0 (SUTURE) ×2
SUT MNCRL 4-0 27XMFL (SUTURE) ×1
SUT SILK 3 0 SH 30 (SUTURE) ×3 IMPLANT
SUT VIC AB 3-0 SH 27 (SUTURE) ×2
SUT VIC AB 3-0 SH 27X BRD (SUTURE) ×1 IMPLANT
SUTURE EHLN 3-0 FS-10 30 BLK (SUTURE) ×1 IMPLANT
SUTURE MNCRL 4-0 27XMF (SUTURE) ×1 IMPLANT
SYR 10ML LL (SYRINGE) ×3 IMPLANT
SYR BULB IRRIG 60ML STRL (SYRINGE) ×3 IMPLANT
TAPE TRANSPORE STRL 2 31045 (GAUZE/BANDAGES/DRESSINGS) IMPLANT
WATER STERILE IRR 1000ML POUR (IV SOLUTION) ×3 IMPLANT

## 2019-04-11 NOTE — Anesthesia Procedure Notes (Signed)
Procedure Name: LMA Insertion Date/Time: 04/11/2019 4:53 PM Performed by: Doreen Salvage, CRNA Pre-anesthesia Checklist: Patient identified, Patient being monitored, Timeout performed, Emergency Drugs available and Suction available Patient Re-evaluated:Patient Re-evaluated prior to induction Oxygen Delivery Method: Circle system utilized Preoxygenation: Pre-oxygenation with 100% oxygen Induction Type: IV induction Ventilation: Mask ventilation without difficulty LMA: LMA inserted LMA Size: 4.0 Tube type: Oral Number of attempts: 1 Placement Confirmation: positive ETCO2 and breath sounds checked- equal and bilateral Tube secured with: Tape Dental Injury: Teeth and Oropharynx as per pre-operative assessment

## 2019-04-11 NOTE — Anesthesia Preprocedure Evaluation (Addendum)
Anesthesia Evaluation  Patient identified by MRN, date of birth, ID band Patient awake    Reviewed: Allergy & Precautions, H&P , NPO status , Patient's Chart, lab work & pertinent test results  History of Anesthesia Complications Negative for: history of anesthetic complications  Airway Mallampati: III  TM Distance: <3 FB Neck ROM: limited    Dental  (+) Chipped, Poor Dentition   Pulmonary neg shortness of breath, asthma ,           Cardiovascular Exercise Tolerance: Good (-) angina(-) Past MI and (-) DOE negative cardio ROS       Neuro/Psych negative neurological ROS  negative psych ROS   GI/Hepatic Neg liver ROS, GERD  Medicated and Controlled,  Endo/Other  negative endocrine ROS  Renal/GU      Musculoskeletal   Abdominal   Peds  Hematology negative hematology ROS (+)   Anesthesia Other Findings Past Medical History: No date: Asthma No date: Breast lump No date: GERD (gastroesophageal reflux disease) No date: Seasonal allergies  Past Surgical History: 03/09/2019: BREAST BIOPSY; Left     Comment:  clyindroma of skin No date: NO PAST SURGERIES     Reproductive/Obstetrics negative OB ROS                             Anesthesia Physical Anesthesia Plan  ASA: III  Anesthesia Plan: General ETT   Post-op Pain Management:    Induction: Intravenous  PONV Risk Score and Plan: Dexamethasone, Ondansetron, Midazolam and Treatment may vary due to age or medical condition  Airway Management Planned: LMA  Additional Equipment:   Intra-op Plan:   Post-operative Plan: Extubation in OR  Informed Consent: I have reviewed the patients History and Physical, chart, labs and discussed the procedure including the risks, benefits and alternatives for the proposed anesthesia with the patient or authorized representative who has indicated his/her understanding and acceptance.     Dental  Advisory Given  Plan Discussed with: Anesthesiologist, CRNA and Surgeon  Anesthesia Plan Comments: (Consent via interpreter   Patient consented for risks of anesthesia including but not limited to:  - adverse reactions to medications - damage to teeth, lips or other oral mucosa - sore throat or hoarseness - Damage to heart, brain, lungs or loss of life  Patient voiced understanding.)      Anesthesia Quick Evaluation

## 2019-04-11 NOTE — Anesthesia Postprocedure Evaluation (Signed)
Anesthesia Post Note  Patient: Melody Norris  Procedure(s) Performed: breast lumpectomy with radioactive seed localization (Left )  Patient location during evaluation: PACU Anesthesia Type: General Level of consciousness: awake and alert Pain management: pain level controlled Vital Signs Assessment: post-procedure vital signs reviewed and stable Respiratory status: spontaneous breathing, nonlabored ventilation and respiratory function stable Cardiovascular status: blood pressure returned to baseline and stable Postop Assessment: no apparent nausea or vomiting Anesthetic complications: no     Last Vitals:  Vitals:   04/11/19 1859 04/11/19 1907  BP: 136/88 130/87  Pulse: 93 95  Resp: 18 18  Temp: 36.7 C   SpO2: 95% 95%    Last Pain:  Vitals:   04/11/19 1907  TempSrc:   PainSc: [place]

## 2019-04-11 NOTE — Transfer of Care (Signed)
Immediate Anesthesia Transfer of Care Note  Patient: Melody Norris  Procedure(s) Performed: Procedure(s): breast lumpectomy with radioactive seed localization (Left)  Patient Location: PACU  Anesthesia Type:General  Level of Consciousness: sedated  Airway & Oxygen Therapy: Patient Spontanous Breathing and Patient connected to face mask oxygen  Post-op Assessment: Report given to RN and Post -op Vital signs reviewed and stable  Post vital signs: Reviewed and stable  Last Vitals:  Vitals:   04/11/19 1023 04/11/19 1813  BP: (!) 139/93 127/76  Pulse: 92 (!) 102  Resp: 18 20  Temp: 36.9 C 36.9 C  SpO2:  33%    Complications: No apparent anesthesia complications

## 2019-04-11 NOTE — Op Note (Signed)
  Procedure Date:  04/11/2019  Pre-operative Diagnosis:  Left breast mass  Post-operative Diagnosis:  Left breast mass  Procedure:  Left breast lumpectomy using ultrasound guidance.  Surgeon:  [name], MD  Anesthesia:  General endotracheal  Estimated Blood Loss:  10 ml  Specimens:  Left breast mass  Complications:  None  Indications for Procedure:  This is a 48 y.o. female who presents with a left breast mass.  The risks of bleeding, infection, injury to surrounding structures, hematoma, seroma, open wound, cosmetic deformity, and the need for further surgery were all discussed with the patient and was willing to proceed.  Description of Procedure: The patient was correctly identified in the preoperative area and brought into the operating room.  The patient was placed supine with VTE prophylaxis in place.  Appropriate time-outs were performed.  Anesthesia was induced and the patient was intubated.  Appropriate antibiotics were infused.  The left chest was prepped and draped in usual sterile fashion.  Ultrasound was used to confirm location of the mass at the marker clip placed by radiology earlier today in the left breast upper inner quadrant, at the 9:30 position.  An elliptical 4 cm  incision was made over the mass.  Cautery was used to dissect down the subcutaneous tissue and breast tissue until reaching the mass.  The mass was then cored out with appropriate margins using cautery.  The mass was oriented with 2-0 Silk sutures as short superior and long lateral.  It was sent off to mammography which confirmed clip within the specimen as well as the mass.  The wound cavity was then thoroughly irrigated and hemostasis was assured.  Local anesthetic was infiltrated into the skin and subcutaneous tissue of the cavity.  The wound was then closed in three layers with 2-0 Vicryl, 3-0 Vicryl and 4-0 Monocryl and sealed with DermaBond.  The patient was emerged from anesthesia and  extubated and brought to the recovery room for further management.  The patient tolerated the procedure well and all counts were correct at the end of the case.   [name], MD

## 2019-04-11 NOTE — Anesthesia Post-op Follow-up Note (Signed)
Anesthesia QCDR form completed.        

## 2019-04-11 NOTE — Interval H&P Note (Signed)
History and Physical Interval Note:  04/11/2019 4:32 PM  Melody Norris  has presented today for surgery, with the diagnosis of LEFT BREAST MASS.  The various methods of treatment have been discussed with the patient and family. After consideration of risks, benefits and other options for treatment, the patient has consented to  Procedure(s): EXCISONAL BREAST BIOPSY WITH NEEDLE LOCALIZATION, LEFT (Left) as a surgical intervention.  The patient's history has been reviewed, patient examined, no change in status, stable for surgery.  I have reviewed the patient's chart and labs.  Questions were answered to the patient's satisfaction.     Fernand Sorbello

## 2019-04-12 ENCOUNTER — Encounter: Payer: Self-pay | Admitting: Surgery

## 2019-04-16 LAB — SURGICAL PATHOLOGY

## 2019-04-17 ENCOUNTER — Telehealth: Payer: Self-pay | Admitting: Surgery

## 2019-04-17 NOTE — Telephone Encounter (Signed)
Telephone Triage Questions    Type of surgery?   breast lumpectomy with radioactive seed localization                     Date?           04/11/2019                   Physician?     Dr. Hampton Abbot   Pain ? Slight pain around the breast  Where and how severe, (1-10) 4-5   Any drainage? (Color) no drainage or pus,  Patient has a picture they could send in   Patient can smell  blood   Any Bright redness around the wound/ area?  Little bit of red   Please call patient and advise.

## 2019-04-17 NOTE — Telephone Encounter (Signed)
I talked with the patient, denies fever or chills. States she has noticed the redness for 2 days. She agrees for appointment for tomorrow, aware of address. 04-18-19 with Shon Millet PA at 9:45.

## 2019-04-18 ENCOUNTER — Other Ambulatory Visit: Payer: Self-pay

## 2019-04-18 ENCOUNTER — Ambulatory Visit (INDEPENDENT_AMBULATORY_CARE_PROVIDER_SITE_OTHER): Payer: Self-pay | Admitting: Physician Assistant

## 2019-04-18 ENCOUNTER — Encounter: Payer: Self-pay | Admitting: Physician Assistant

## 2019-04-18 VITALS — BP 129/80 | HR 84 | Temp 97.9°F | Wt 220.0 lb

## 2019-04-18 DIAGNOSIS — T8149XA Infection following a procedure, other surgical site, initial encounter: Secondary | ICD-10-CM

## 2019-04-18 DIAGNOSIS — D239 Other benign neoplasm of skin, unspecified: Secondary | ICD-10-CM

## 2019-04-18 DIAGNOSIS — Z09 Encounter for follow-up examination after completed treatment for conditions other than malignant neoplasm: Secondary | ICD-10-CM

## 2019-04-18 MED ORDER — AMOXICILLIN-POT CLAVULANATE 875-125 MG PO TABS: 1.0000 | ORAL_TABLET | Freq: Two times a day (BID) | ORAL | 0 refills | Status: AC

## 2019-04-18 NOTE — Progress Notes (Signed)
[facility] POST-OP OFFICE VISIT  04/18/2019  HPI: Melody Norris is a 48 y.o. female 7 days s/p left breast lumpectomy. She reports she is overall doing well. Some pain which improves with pain medications. She is concerning that the wound is more erythematous over the last few days and there is a "white spot" near the medial portion of the wound. This is worrisome to her because her cousin had the same surgery and had a gangrenous infection. No fevers or chills at home. No other issues.   Vital signs: BP 129/80   Pulse 84   Temp 97.9 F (36.6 C)   Wt 220 lb (99.8 kg)   SpO2 97%   BMI 35.51 kg/m    Physical Exam: Constitutional: Well appearing female, NAD  Left Breast: There is a 4 cm incision to the 9-10 O'clock position of the left breast which is intact with dermabond, there is slight erythema surrounding the wound, this is tender to palpation, the most later 1 cm is white in discoloration, no fluctuance, no expressible drainage.   Assessment/Plan: This is a 48 y.o. female with possible early superficial wound infection without abscess or evidence of necrotizing infection 1 week s/p left breast lumpectomy.    - As a precaution, will give 1 week of Augmentin BID  - Continue pain control as needed  - return precautions given (worsening erythema, purulent discharge, fever >101), which she verbalized understanding  - reviewed pathology; spiradenoma, negative for malignancy  - will follow up in ~10 days for re-check of wound  -- Edison Simon, PA-C [facility] 04/18/2019, 9:59 AM [phone] M-F: 7am - 4pm

## 2019-04-18 NOTE — Patient Instructions (Addendum)
We have sent in a prescription for antibiotics to your pharmacy.   We will see you back next week to look at the area.  Call us if you get any worsening discharge that smells bad, bad fever, or spreading redness.

## 2019-04-25 ENCOUNTER — Other Ambulatory Visit: Payer: Self-pay

## 2019-04-25 ENCOUNTER — Encounter: Payer: Self-pay | Admitting: Physician Assistant

## 2019-04-25 ENCOUNTER — Ambulatory Visit (INDEPENDENT_AMBULATORY_CARE_PROVIDER_SITE_OTHER): Payer: Self-pay | Admitting: Physician Assistant

## 2019-04-25 VITALS — BP 132/90 | HR 92 | Temp 97.5°F | Ht 63.0 in | Wt 224.0 lb

## 2019-04-25 DIAGNOSIS — N6320 Unspecified lump in the left breast, unspecified quadrant: Secondary | ICD-10-CM

## 2019-04-25 DIAGNOSIS — Z09 Encounter for follow-up examination after completed treatment for conditions other than malignant neoplasm: Secondary | ICD-10-CM

## 2019-04-25 DIAGNOSIS — D239 Other benign neoplasm of skin, unspecified: Secondary | ICD-10-CM

## 2019-04-25 MED ORDER — GABAPENTIN 300 MG PO CAPS: 300.0000 mg | ORAL_CAPSULE | Freq: Three times a day (TID) | ORAL | 0 refills | Status: DC

## 2019-04-25 MED ORDER — IBUPROFEN 600 MG PO TABS: 600.0000 mg | ORAL_TABLET | Freq: Four times a day (QID) | ORAL | 0 refills | Status: DC | PRN

## 2019-04-25 NOTE — Progress Notes (Signed)
[facility] POST-OP OFFICE VISIT  04/25/2019  HPI: Melody Norris is a 48 y.o. female 15 days s/p left breast lumpectomy for left breast mass with Dr Hampton Abbot. Pathology showed spiradenoma.   Today, she reports that her biggest concern is a "burning sensation" around the incision. She tried ibuprofen without significant improvement. No fever or worsening erythema. She does notice intermittent "yellow" drainage which "smells like blood." No foul smelling drainage. She has been slowly increasing activity but notes that movement makes her "feel like the incision is going to open up." No further complaints. She has completed her course of ABx for possible wound infection.   Please note interview was obtained with help of spanish medical interpretor.    Vital signs: BP 132/90   Pulse 92   Temp (!) 97.5 F (36.4 C)   Ht 5\' 3"  (1.6 m)   Wt 224 lb (101.6 kg)   LMP 04/24/2019 (Exact Date)   SpO2 96%   BMI 39.68 kg/m    Physical Exam: Constitutional: Well appearing female, NAD Left Breast: There is a 4 cm incision to the 9-10 O'clock position of the left breast which is intact with dermabond, previous erythema resolved, no drainage appreciable. No fluctuance.   Assessment/Plan: This is a 48 y.o. female 15 days s/p left breast lumpectomy.   - Pain control; refilled ibuprofen; will try course of gabapentin for "burning" pain  - Dry dressing prn  - No evidence of infection/abscess  - gradually increase in activity  - Reviewed pathology: Spiradenoma   - rtc 1-2 weeks for re-check with Dr Hampton Abbot  -- Edison Simon, PA-C [facility] 04/25/2019, 10:40 AM [phone] M-F: 7am - 4pm

## 2019-04-25 NOTE — Patient Instructions (Addendum)
May use ice/cold to the area as needed.   We have sent in a prescription for Gabapentin for you to try.  We have refilled your prescription for Ibuprofen.  We will keep you out of work for one more week.  May keep a dressing over the area while draining.   Follow up in 1-2 weeks with Dr Hampton Abbot.

## 2019-04-27 ENCOUNTER — Encounter: Payer: Self-pay | Admitting: Physician Assistant

## 2019-05-08 ENCOUNTER — Ambulatory Visit (INDEPENDENT_AMBULATORY_CARE_PROVIDER_SITE_OTHER): Payer: Self-pay | Admitting: Surgery

## 2019-05-08 ENCOUNTER — Encounter: Payer: Self-pay | Admitting: Surgery

## 2019-05-08 ENCOUNTER — Other Ambulatory Visit: Payer: Self-pay

## 2019-05-08 VITALS — BP 135/84 | HR 88 | Temp 97.9°F | Ht 63.0 in | Wt 230.0 lb

## 2019-05-08 DIAGNOSIS — Z09 Encounter for follow-up examination after completed treatment for conditions other than malignant neoplasm: Secondary | ICD-10-CM

## 2019-05-08 DIAGNOSIS — D239 Other benign neoplasm of skin, unspecified: Secondary | ICD-10-CM

## 2019-05-08 NOTE — Progress Notes (Signed)
05/08/2019  HPI: Melody Norris is a 48 y.o. female s/p left breast mass excision on 8/19, revealing cylindroma of skin.  She presents today for follow up.  She reports that on 9/10, she noticed overnight that her wound opened up.  She thinks she lied in bed in a position that put more stress on the wound.  She has noted serous drainage.  Denies any worsening pain, redness of the skin or induration, or fevers/chills.  She does note a rash around the wound which she's not sure if is related to tape or to neosporin that she applied around the wound.  Vital signs: BP 135/84   Pulse 88   Temp 97.9 F (36.6 C)   Ht 5\' 3"  (1.6 m)   Wt 230 lb (104.3 kg)   LMP 04/24/2019 (Exact Date)   SpO2 98%   BMI 40.74 kg/m    Physical Exam: Constitutional: No acute distress Skin:  Left breast incision has opened.  At it's widest point, it's about 5 mm separation.  The wound bed itself is healthy, with granulation tissue, no evidence of infection.  There is no erythema.  There is very mild macular rash around the incision.  Assessment/Plan: This is a 48 y.o. female s/p left breast mass excision.  --Discussed with patient that unfortunately the wound has opened but at this point, we have to let it heal by secondary intention.  Recommended that she do daily gauze dressing changes and change more often if she's sweating or gets dirty.  She can shower every day but not submerge the wound.  This will continue to heal.  No antibiotics needed. --Will give her work note so she can still be out of work for two more weeks as the wound heals since she's active in [employer] work. --Follow up in two weeks for wound check.   [name], [facility]

## 2019-05-08 NOTE — Patient Instructions (Signed)
May keep a dressing over the area while it is open. Change your dressing once or twice a day.   You may shower, remove the dressing first, wash and rinse well, pat dry and replace your dressing.   May use Ibuprofen or Tylenol as needed for discomfort.   We will keep you out of work until we see you again in 2 weeks.   Follow up in 2 weeks.

## 2019-05-23 ENCOUNTER — Encounter: Payer: Self-pay | Admitting: Surgery

## 2019-05-23 ENCOUNTER — Other Ambulatory Visit: Payer: Self-pay

## 2019-05-23 ENCOUNTER — Ambulatory Visit (INDEPENDENT_AMBULATORY_CARE_PROVIDER_SITE_OTHER): Payer: Self-pay | Admitting: Surgery

## 2019-05-23 VITALS — BP 118/84 | HR 86 | Temp 97.5°F | Ht 63.0 in | Wt 227.0 lb

## 2019-05-23 DIAGNOSIS — D239 Other benign neoplasm of skin, unspecified: Secondary | ICD-10-CM

## 2019-05-23 DIAGNOSIS — Z09 Encounter for follow-up examination after completed treatment for conditions other than malignant neoplasm: Secondary | ICD-10-CM

## 2019-05-23 NOTE — Patient Instructions (Addendum)
Follow up with [place] in 2 weeks with Edison Simon.   Continue dry dressing changes daily.

## 2019-05-23 NOTE — Progress Notes (Signed)
05/23/2019  HPI: Melody Norris is a 48 y.o. female s/p left breast skin cyst excision on 8/19.  Her wound opened and has been doing dry dressing changes.  Denies any worsening pain.  Reports the wound is healing but still open.  Vital signs: BP 118/84   Pulse 86   Temp (!) 97.5 F (36.4 C)   Ht 5\' 3"  (1.6 m)   Wt 227 lb (103 kg)   LMP 04/24/2019 (Exact Date)   SpO2 97%   BMI 40.21 kg/m    Physical Exam: Constitutional:  No acute distress Skin:  Left medial breast skin incision is open, with lateral edge with good healthy tissue and medial edge with fibrinous material covering the wound bed.  This was removed at bedside sharply, revealing healthy wound bed.  Dressed with dry gauze dressing.  Assessment/Plan: This is a 48 y.o. female s/p left breast skin cyst excision  --Patient is healing well.  Continue dry dressing changes daily. --No antibiotics needed at this point. --Follow up in 2 weeks.   Melody Norris, [facility]

## 2019-06-06 ENCOUNTER — Encounter: Payer: Self-pay | Admitting: Physician Assistant

## 2019-06-07 ENCOUNTER — Encounter: Payer: Self-pay | Admitting: Physician Assistant

## 2019-06-07 ENCOUNTER — Other Ambulatory Visit: Payer: Self-pay

## 2019-06-07 ENCOUNTER — Ambulatory Visit (INDEPENDENT_AMBULATORY_CARE_PROVIDER_SITE_OTHER): Payer: Self-pay | Admitting: Physician Assistant

## 2019-06-07 VITALS — BP 120/81 | HR 90 | Temp 97.9°F | Resp 14 | Ht 63.0 in | Wt 227.0 lb

## 2019-06-07 DIAGNOSIS — T8149XA Infection following a procedure, other surgical site, initial encounter: Secondary | ICD-10-CM

## 2019-06-07 DIAGNOSIS — Z09 Encounter for follow-up examination after completed treatment for conditions other than malignant neoplasm: Secondary | ICD-10-CM

## 2019-06-07 NOTE — Progress Notes (Signed)
[facility] POST-OP OFFICE VISIT  06/07/2019  HPI: Melody Norris is a 48 y.o. female ~ 2 months s/p left breast lumpectomy with Dr Hampton Abbot. She has been having issues with wound healing. Mostly had fibrinous material debrided from wound on 09/30.   Today she reports that she continues to slowly get better. No fever or chills. She is noticing intermittent drainage from the wound. No other complaints.   Vital signs: BP 120/81   Pulse 90   Temp 97.9 F (36.6 C) (Temporal)   Resp 14   Ht 5\' 3"  (1.6 m)   Wt 227 lb (103 kg)   SpO2 97%   BMI 40.21 kg/m    Physical Exam: Constitutional: Well appearing female, NAD Skin: At the 9-10 o'clock position of the medial left breast, there is a aprox. 2 x 1 cm healing by secondary intention, there is good granulation tissue in the wound bed. No surrounding erythema, no drainage.    Assessment/Plan: This is a 48 y.o. female  ~ 2 months s/p left breast lumpectomy with Dr Hampton Abbot. She has been having issues with wound healing.   - Dry dressing prn  - no indication for ABx  - work note provided  - rtc in 10 days for re-check  -- Edison Simon, PA-C [facility] 06/07/2019, 9:43 AM [phone] M-F: 7am - 4pm

## 2019-06-07 NOTE — Patient Instructions (Signed)
We applied silver nitrate, however this will cause the drainage from the wound to have a dark grayish color.  Please call our office if you have questions or concerns.

## 2019-06-13 ENCOUNTER — Ambulatory Visit: Payer: Self-pay | Admitting: Physician Assistant

## 2019-06-21 ENCOUNTER — Ambulatory Visit (INDEPENDENT_AMBULATORY_CARE_PROVIDER_SITE_OTHER): Payer: Self-pay | Admitting: Physician Assistant

## 2019-06-21 ENCOUNTER — Encounter: Payer: Self-pay | Admitting: Physician Assistant

## 2019-06-21 ENCOUNTER — Other Ambulatory Visit: Payer: Self-pay

## 2019-06-21 VITALS — BP 123/85 | HR 96 | Temp 95.5°F | Ht 63.0 in | Wt 230.0 lb

## 2019-06-21 DIAGNOSIS — Z09 Encounter for follow-up examination after completed treatment for conditions other than malignant neoplasm: Secondary | ICD-10-CM

## 2019-06-21 DIAGNOSIS — N6320 Unspecified lump in the left breast, unspecified quadrant: Secondary | ICD-10-CM

## 2019-06-21 NOTE — Patient Instructions (Signed)
Please call with any questions or concerns.

## 2019-06-21 NOTE — Progress Notes (Signed)
[facility] POST-OP OFFICE VISIT  06/21/2019  HPI: Melody Norris is a 48 y.o. female  > 2 months s/p left breast lumpectomy with Dr Hampton Abbot. She has been having issues with wound healing  Today, she reports that she is doing much better. Her incision has closed almost completely. The is a <5 mm opening at the inferior portion, intermittent serous drainage. No erythema or drainage. No fever or chills.   Vital signs: BP 123/85   Pulse 96   Temp (!) 95.5 F (35.3 C) (Temporal)   Ht 5\' 3"  (1.6 m)   Wt 230 lb (104.3 kg)   SpO2 96%   BMI 40.74 kg/m    Physical Exam: Constitutional: Well appearing female, NAD Skin: At the 9-10 o'clock position of the medial left breast, there is a well healed incision, there is only a <5 mm opening to the inferior portion, no surrounding erythema or drainage.     Assessment/Plan: This is a 48 y.o. female > 2 months s/p left breast lumpectomy   - dry dressings prn  - discussed wound will likely be closed in the next week  - pain control prn  - advised to call with questions or concerns  - follow up prn  -- Edison Simon, PA-C [facility] 06/21/2019, 11:47 AM [phone] M-F: 7am - 4pm

## 2019-06-29 NOTE — Progress Notes (Signed)
Opened in Error.

## 2019-06-29 NOTE — Progress Notes (Signed)
Letter mailed to patient with normal pap results/ negative HPV.  Next pap due in 5 years.

## 2019-07-02 NOTE — Progress Notes (Signed)
Letter mailed to notify patient of normal pap results. Copy to [facility].

## 2019-10-28 IMAGING — MG MM CLIP PLACEMENT
4 series · 4 of 12 positions shown · non-contrast
Comparison: Previous exam(s).

CLINICAL DATA: Post biopsy mammogram of the left breast for clip
placement.

EXAM:
DIAGNOSTIC LEFT MAMMOGRAM POST ULTRASOUND CLIP PLACEMENT

[L CV synth-2D (1 of 2)]
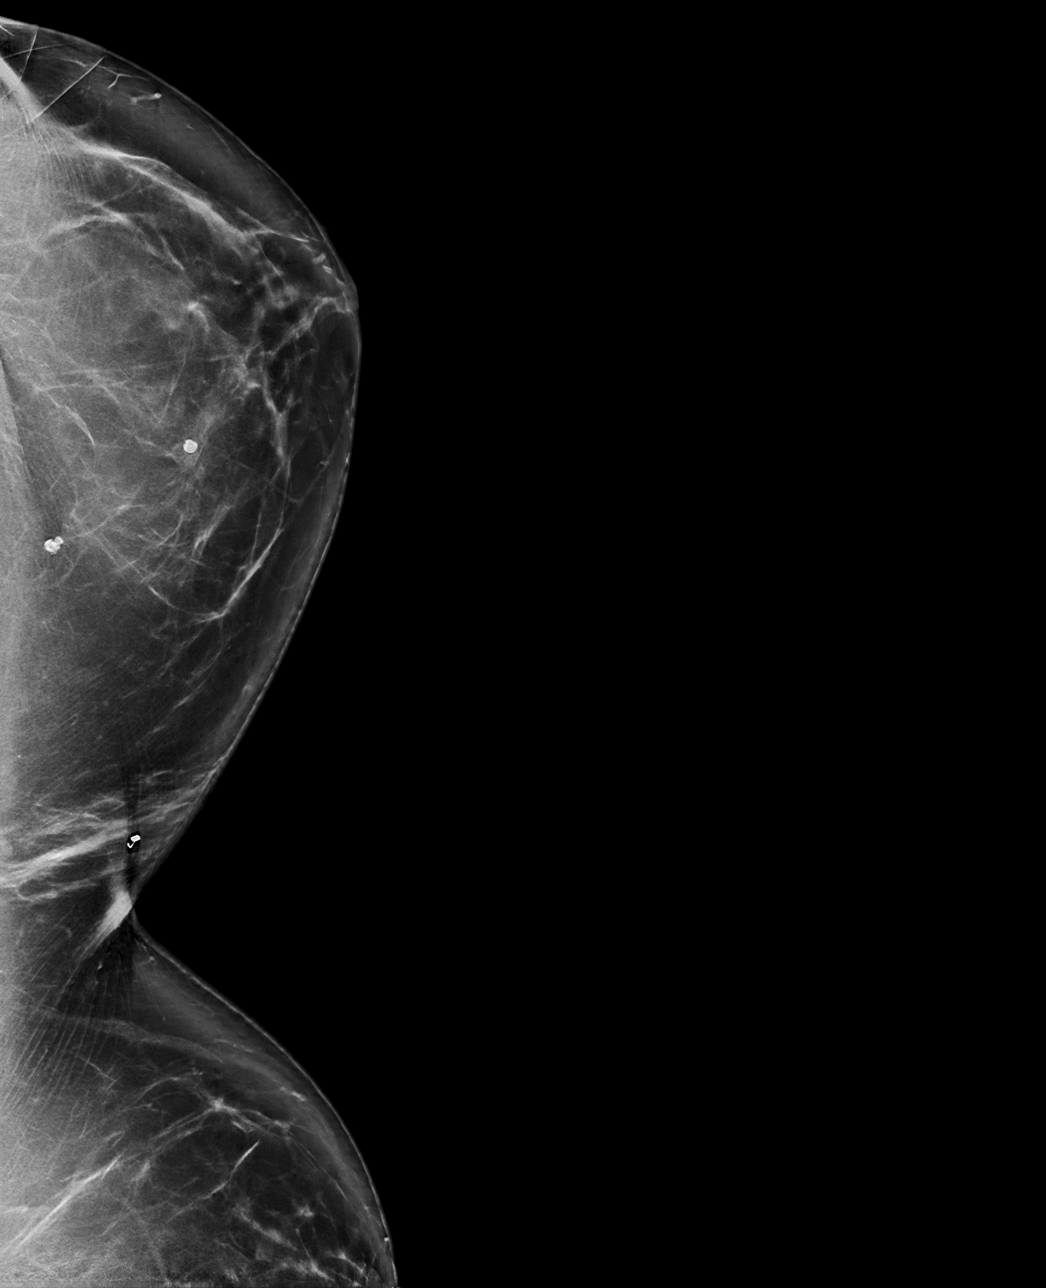

[L CV synth-2D (2 of 2)]
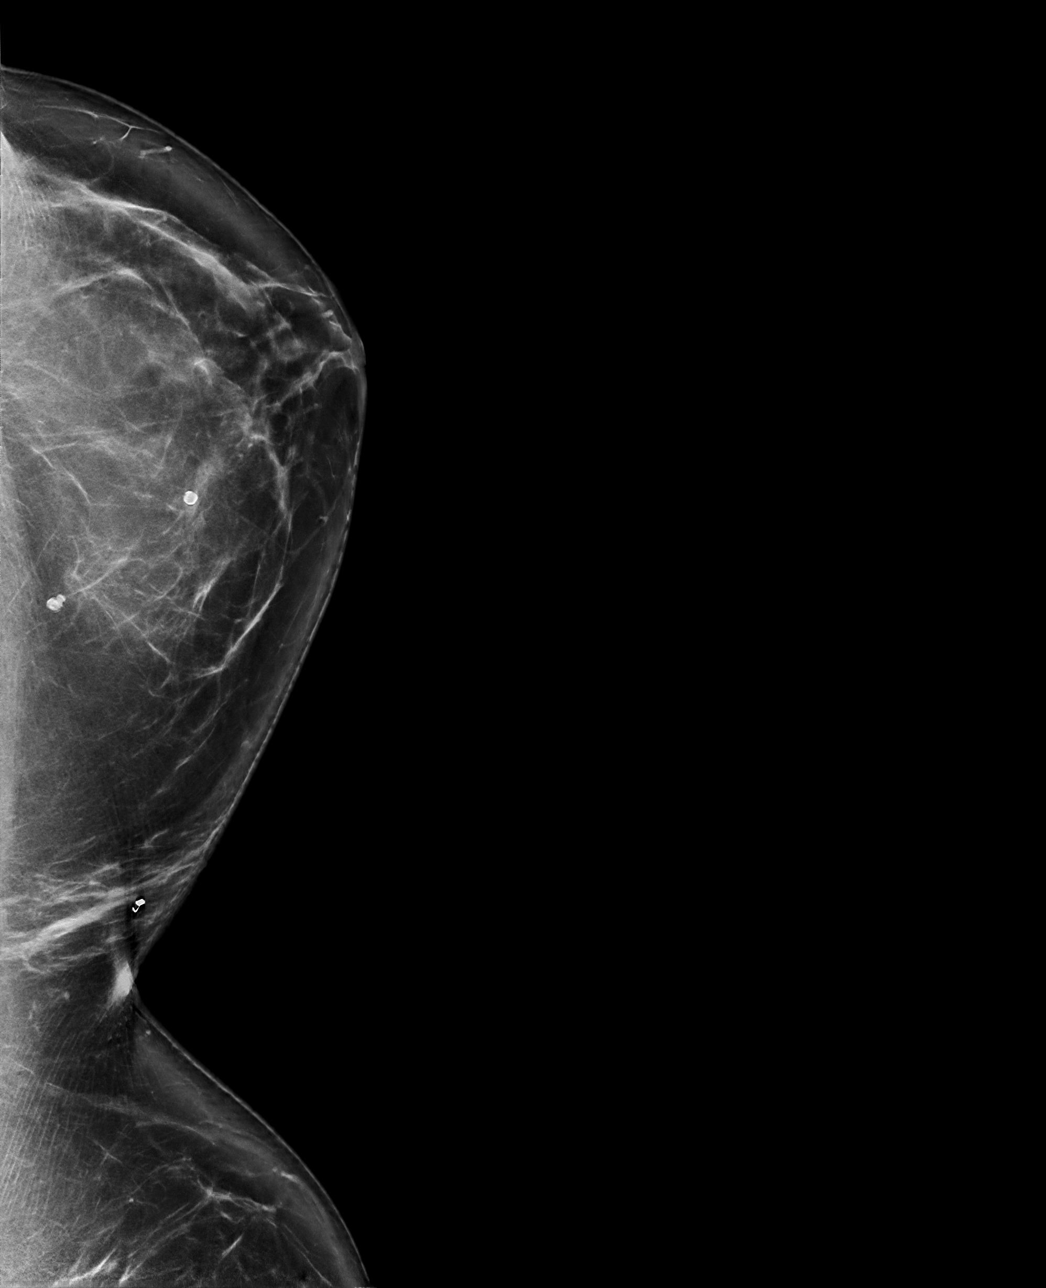

[L CV tomo (1 of 2) · tomo slice 47/93.0]
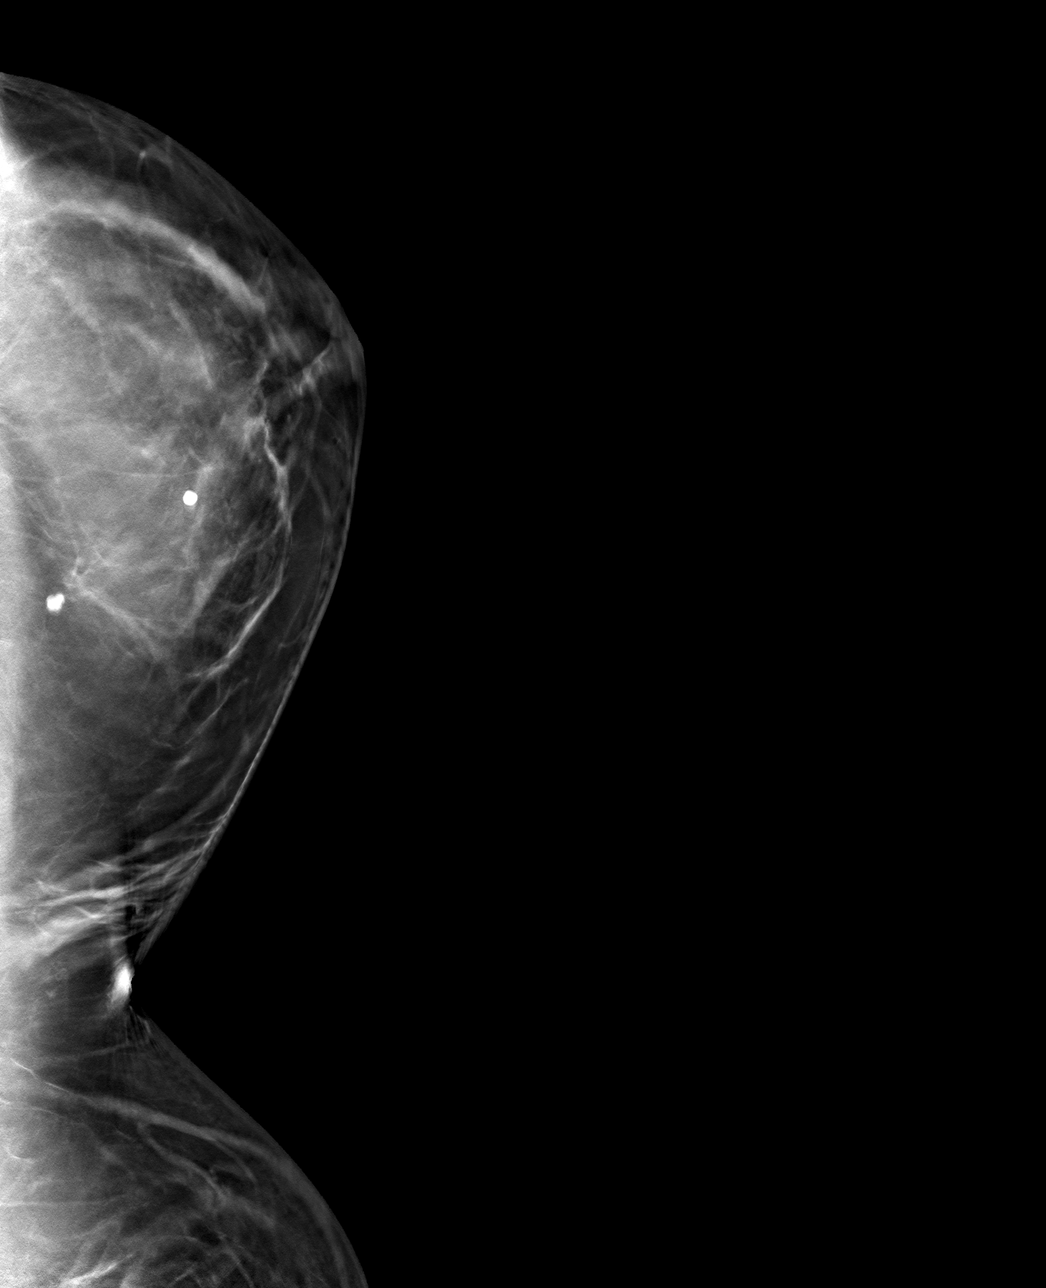

[L CV tomo (2 of 2) · tomo slice 49/98.0]
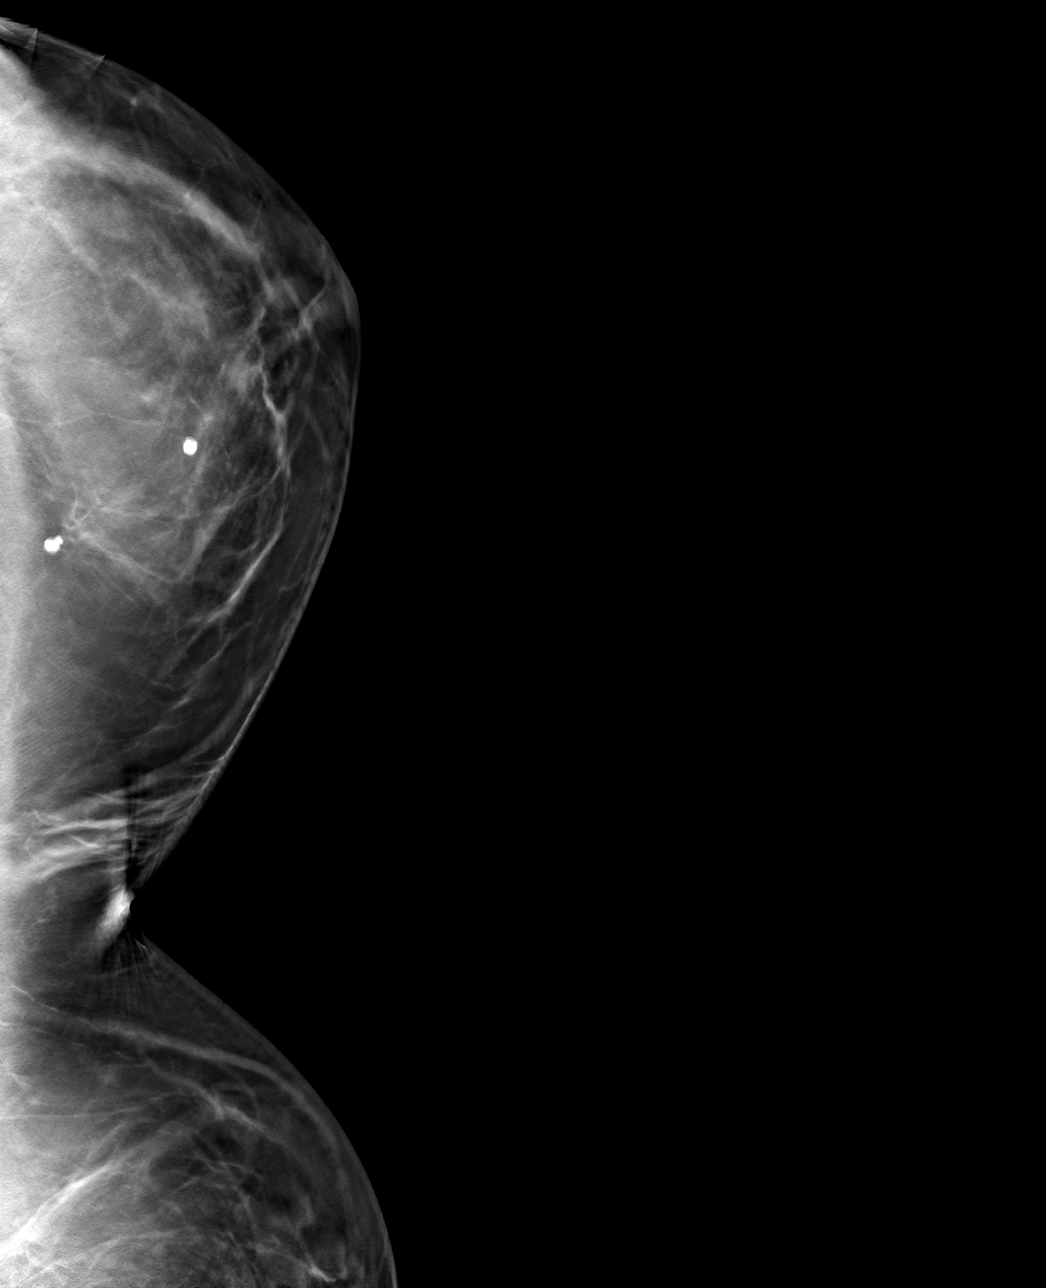

[4 of 12 positions shown; findings below may reference images not displayed]

FINDINGS: Mammographic images were obtained following ultrasound guided clip
placement of a previously biopsied area in the medial left breast.
The coil shaped biopsy marking clip is well positioned at the site
of the previous biopsy in the medial left breast.
IMPRESSION: Appropriate positioning of the coil shaped biopsy marking clip in
the medial left breast.

Final Assessment: Post Procedure Mammograms for Marker Placement

## 2019-10-28 IMAGING — MG ULTRASOUND LEFT BREAST LIMITED
1 series · 2 of 2 positions shown · non-contrast
Comparison: Previous exam(s).

CLINICAL DATA: Ultrasound-guided clip placement and skin marking
for surgery.

EXAM:
ULTRASOUND GUIDED CLIP PLACEMENT OF THE LEFT BREAST

[Series 1: MG view · 0.06mm/px · 2 of 2 slices shown]
[im 1/2]
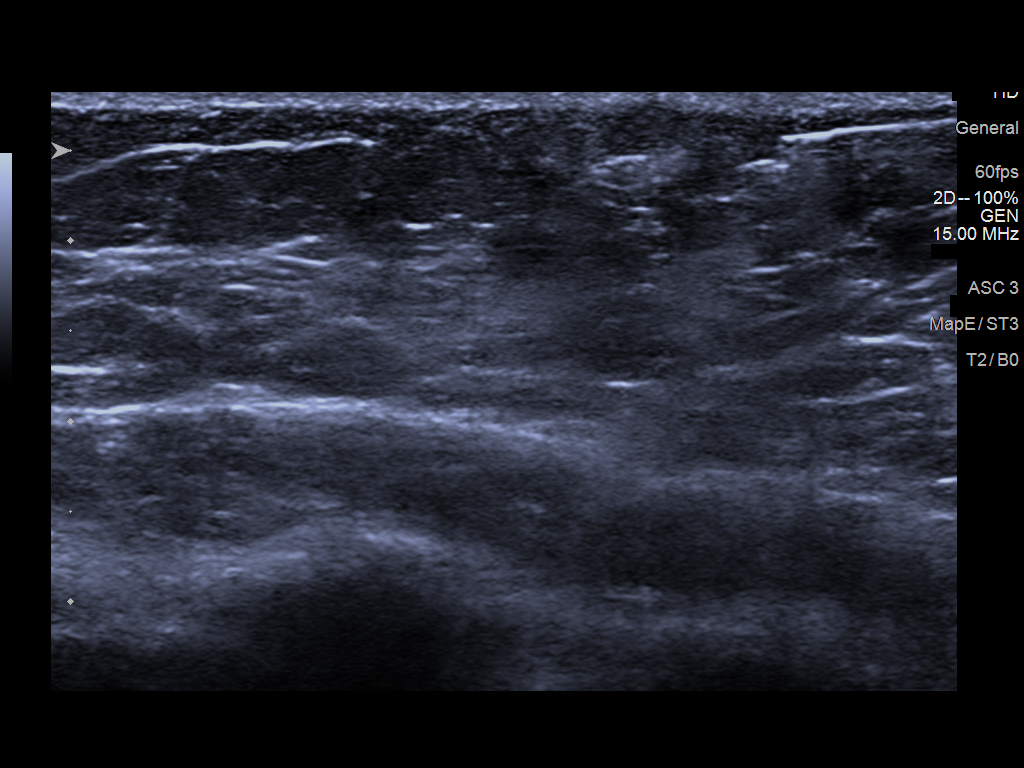
[im 2/2]
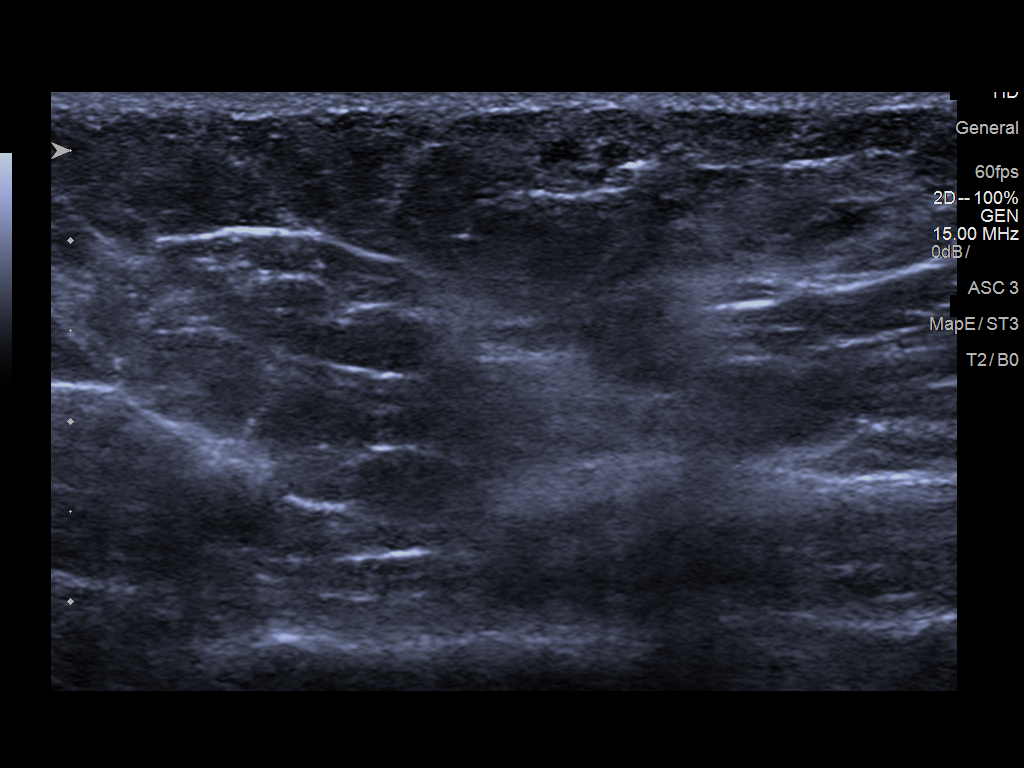

[2 of 2 positions shown; findings below may reference images not displayed]

FINDINGS: I had the patient point out the palpable area in the left breast
that brought her in initially for workup. This did localize to the
930 axis in the left breast near the sternum rather than 11 o'clock
as labeled on the prior biopsy images, [DATE] of the diagnostic
ultrasound images and in the prior reports. The entry site for the
biopsy can be seen on the right side of the sternum, consistent with
the medial approach described in the biopsy report, confirming that
the lesion is at [DATE]. Additionally, I scanned the superior aspect
of the left breast including the 11 o'clock axis to ensure that no
additional similar-appearing lesion was seen. I spoke with both the
radiologist who did the diagnostic workup as well as the 1 performed
in the biopsy to confirm that only 1 lesion was found, and that same
lesion was biopsied. Finally, I discussed all of this with Dr.
Arissa and the patient, and we all were in agreement that the mass
in the breast was truly medially located and where the clip should
be placed.

I met with the patient and we discussed the procedure of
ultrasound-guided clip placement. We discussed the risks of the
procedure including infection, bleeding and clip migration. Informed
written consent was given. The usual time out protocol was performed
immediately prior to the procedure.

Lesion quadrant: Upper inner quadrant

Using sterile technique and 1% lidocaine as local anesthetic, under
direct ultrasound guided visualization a coil shaped biopsy marking
clip was placed within the mass in the left breast at [DATE] using an
inferior approach. After the clip placement, a sharpie marker was
used to circle the lesion, and was covered by Tegaderm. A follow-up
mammogram was performed and dictated separately.
IMPRESSION: IMPRESSION
Successful placement of a coil shaped biopsy marking clip within the
previously biopsied mass in the left breast at [DATE]. The lesion was
also marked with a sharpie and covered with Tegaderm to assist with
localization prior to surgery, as per the plan discussed with Dr.
Arissa.

## 2023-09-07 ENCOUNTER — Emergency Department
Admission: EM | Admit: 2023-09-07 | Discharge: 2023-09-07 | Disposition: A | Discharge status: Home / Self Care | Payer: Medicaid Other | Attending: Emergency Medicine | Admitting: Emergency Medicine

## 2023-09-07 ENCOUNTER — Other Ambulatory Visit: Payer: Self-pay

## 2023-09-07 ENCOUNTER — Encounter: Payer: Self-pay | Admitting: Emergency Medicine

## 2023-09-07 DIAGNOSIS — X58XXXA Exposure to other specified factors, initial encounter: Secondary | ICD-10-CM | POA: Diagnosis not present

## 2023-09-07 DIAGNOSIS — S39011A Strain of muscle, fascia and tendon of abdomen, initial encounter: Secondary | ICD-10-CM | POA: Diagnosis not present

## 2023-09-07 DIAGNOSIS — E860 Dehydration: Secondary | ICD-10-CM | POA: Insufficient documentation

## 2023-09-07 DIAGNOSIS — S3991XA Unspecified injury of abdomen, initial encounter: Secondary | ICD-10-CM | POA: Diagnosis present

## 2023-09-07 LAB — CBC WITH DIFFERENTIAL/PLATELET
Abs Immature Granulocytes: 0.02 10*3/uL (ref 0.00–0.07)
Basophils Absolute: 0.1 10*3/uL (ref 0.0–0.1)
Basophils Relative: 1 %
Eosinophils Absolute: 0.2 10*3/uL (ref 0.0–0.5)
Eosinophils Relative: 2 %
HCT: 42.8 % (ref 36.0–46.0)
Hemoglobin: 14.3 g/dL (ref 12.0–15.0)
Immature Granulocytes: 0 %
Lymphocytes Relative: 28 %
Lymphs Abs: 2.5 10*3/uL (ref 0.7–4.0)
MCH: 29.1 pg (ref 26.0–34.0)
MCHC: 33.4 g/dL (ref 30.0–36.0)
MCV: 87.2 fL (ref 80.0–100.0)
Monocytes Absolute: 0.7 10*3/uL (ref 0.1–1.0)
Monocytes Relative: 8 %
Neutro Abs: 5.5 10*3/uL (ref 1.7–7.7)
Neutrophils Relative %: 61 %
Platelets: 346 10*3/uL (ref 150–400)
RBC: 4.91 MIL/uL (ref 3.87–5.11)
RDW: 12.7 % (ref 11.5–15.5)
WBC: 9 10*3/uL (ref 4.0–10.5)
nRBC: 0 % (ref 0.0–0.2)

## 2023-09-07 LAB — URINALYSIS, ROUTINE W REFLEX MICROSCOPIC
Bilirubin Urine: NEGATIVE
Glucose, UA: NEGATIVE mg/dL
Hgb urine dipstick: NEGATIVE
Ketones, ur: NEGATIVE mg/dL
Leukocytes,Ua: NEGATIVE
Nitrite: NEGATIVE
Protein, ur: NEGATIVE mg/dL
Specific Gravity, Urine: 1.026 (ref 1.005–1.030)
pH: 5 (ref 5.0–8.0)

## 2023-09-07 LAB — COMPREHENSIVE METABOLIC PANEL
ALT: 28 U/L (ref 0–44)
AST: 27 U/L (ref 15–41)
Albumin: 4 g/dL (ref 3.5–5.0)
Alkaline Phosphatase: 74 U/L (ref 38–126)
Anion gap: 12 (ref 5–15)
BUN: 16 mg/dL (ref 6–20)
CO2: 19 mmol/L — ABNORMAL LOW (ref 22–32)
Calcium: 9.2 mg/dL (ref 8.9–10.3)
Chloride: 102 mmol/L (ref 98–111)
Creatinine, Ser: 0.76 mg/dL (ref 0.44–1.00)
GFR, Estimated: >60 mL/min (ref >60)
Glucose, Bld: 84 mg/dL (ref 70–99)
Potassium: 4 mmol/L (ref 3.5–5.1)
Sodium: 133 mmol/L — ABNORMAL LOW (ref 135–145)
Total Bilirubin: 0.8 mg/dL (ref 0.0–1.2)
Total Protein: 7.3 g/dL (ref 6.5–8.1)

## 2023-09-07 LAB — POC URINE PREG, ED: Preg Test, Ur: NEGATIVE

## 2023-09-07 LAB — LIPASE, BLOOD: Lipase: 32 U/L (ref 11–51)

## 2023-09-07 LAB — PREGNANCY, URINE: Preg Test, Ur: NEGATIVE

## 2023-09-07 MED ORDER — CYCLOBENZAPRINE HCL 10 MG PO TABS: 10.0000 mg | ORAL_TABLET | Freq: Three times a day (TID) | ORAL | 0 refills | Status: AC | PRN

## 2023-09-07 NOTE — Discharge Instructions (Addendum)
 You have been diagnosed with abdominal wall strain. Please take Flexeril  3 times per day. Please make an appointment with neurology to follow up loss of memory.  Please take plenty of fluids, Gatorade, electrolytes.  Please come back to ED if you have new symptoms or worsen symptoms.

## 2023-09-07 NOTE — ED Provider Triage Note (Signed)
 Emergency Medicine Provider Triage Evaluation Note  Melody Norris , a 53 y.o. female  was evaluated in triage.  Pt complains of left upper quadrant/left flank pain since yesterday. Feels burning pain. No n/v/d, no dysuria. No previous abdominal surgery.  Review of Systems  Positive: Luq/flank pain Negative: N/v/d, fevers  Physical Exam  There were no vitals taken for this visit. Gen:   Awake, no distress   Resp:  Normal effort  MSK:   Moves extremities without difficulty  Other:    Medical Decision Making  Medically screening exam initiated at 4:50 PM.  Appropriate orders placed.  Melody Norris was informed that the remainder of the evaluation will be completed by another provider, this initial triage assessment does not replace that evaluation, and the importance of remaining in the ED until their evaluation is complete.     Luverne Zerkle E, PA-C 09/07/23 1708

## 2023-09-07 NOTE — ED Provider Notes (Signed)
 [facility] Provider Note    Event Date/Time   First MD Initiated Contact with Patient 09/07/23 2140     (approximate)   History   Flank Pain   HPI  Melody Norris is a 53 y.o. female  who presents with history of lLEQ pain, aggravates with movement. Patient states having bilateral arm pain. 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 Patient states, LMP was 08/01/2023. Patient states having memory loss. Patient with history of depression.       Physical Exam   Triage Vital Signs: ED Triage Vitals [09/07/23 1707]  Encounter Vitals Group     BP (!) 146/92     Systolic BP Percentile      Diastolic BP Percentile      Pulse Rate 79     Resp 18     Temp 98.1 F (36.7 C)     Temp Source Oral     SpO2 98 %     Weight 235 lb (106.6 kg)     Height 5\' 3"  (1.6 m)     Head Circumference      Peak Flow      Pain Score 8     Pain Loc      Pain Education      Exclude from Growth Chart     Most recent vital signs: Vitals:   09/07/23 1707  BP: (!) 146/92  Pulse: 79  Resp: 18  Temp: 98.1 F (36.7 C)  SpO2: 98%     Constitutional: Alert non distress Eyes: Conjunctivae are normal.  Head: Atraumatic. Nose: No congestion/rhinnorhea. Mouth/Throat: Mucous membranes are moist.   Neck: Painless ROM.  Cardiovascular:   Good peripheral circulation.Regular rhythm.  Respiratory: Normal respiratory effort.  No retractions. No wheezing  Gastrointestinal: Soft. Tender to palpation on LUQ, pain increase with flexion.  Musculoskeletal:  no deformity LUE: Presence of old hematoma in posterior shoulder Neurologic:  MAE spontaneously. No gross focal neurologic deficits are appreciated.  Skin:  Skin is warm, dry and intact. No rash noted. Psychiatric: Mood and affect are normal. Speech and behavior are normal.    ED Results / Procedures / Treatments   Labs (all labs ordered are listed, but only abnormal results are  displayed) Labs Reviewed  COMPREHENSIVE METABOLIC PANEL - Abnormal; Notable for the following components:      Result Value   Sodium 133 (*)    CO2 19 (*)    All other components within normal limits  URINALYSIS, ROUTINE W REFLEX MICROSCOPIC - Abnormal; Notable for the following components:   Color, Urine YELLOW (*)    APPearance CLEAR (*)    All other components within normal limits  CBC WITH DIFFERENTIAL/PLATELET  LIPASE, BLOOD  PREGNANCY, URINE  POC URINE PREG, ED     EKG    RADIOLOGY   PROCEDURES:  Critical Care performed:   Procedures   MEDICATIONS ORDERED IN ED: Medications - No data to display   IMPRESSION / MDM / ASSESSMENT AND PLAN / ED COURSE  I reviewed the triage vital signs and the nursing notes.  Differential diagnosis includes, but is not limited to, muscular strain, pyelonephritis, MI, epigastralgia.  Patient's presentation is most consistent with acute complicated illness / injury requiring diagnostic workup.   Patient's diagnosis is consistent with muscular strain, dehydration. Labs are rea reassuring. I did review the patient's allergies and medications. Patient will be discharged home with prescriptions for flexeril . Patient is to follow up  with Neurology as needed or otherwise directed. Patient is given ED precautions to return to the ED for any worsening or new symptoms. Discussed plan of care with patient, answered all of patient's questions, Patient agreeable to plan of care. Advised patient to take medications according to the instructions on the label. Discussed possible side effects of new medications. Patient verbalized understanding.     FINAL CLINICAL IMPRESSION(S) / ED DIAGNOSES   Final diagnoses:  Dehydration  Strain of abdominal wall, initial encounter     Rx / DC Orders   ED Discharge Orders          Ordered    cyclobenzaprine  (FLEXERIL ) 10 MG tablet  3 times daily PRN        09/07/23 2152             Note:   This document was prepared using Dragon voice recognition software and may include unintentional dictation errors.   Awilda Lennox, PA-C 09/07/23 2154    Ruth Cove, MD 09/07/23 2248

## 2023-09-07 NOTE — ED Triage Notes (Signed)
 Patient ambulatory to triage with complaints of left sided flank/"ovary pain" since yesterday. Also endorses nausea.

## 2024-01-13 ENCOUNTER — Other Ambulatory Visit: Payer: Self-pay | Admitting: Nurse Practitioner

## 2024-01-13 DIAGNOSIS — Z1231 Encounter for screening mammogram for malignant neoplasm of breast: Secondary | ICD-10-CM

## 2024-06-12 NOTE — H&P (Signed)
 Preoperative History and Physical  Melody Norris is a 53 y.o. G5P0 here for surgical management of abnormal uterine bleeding, endometrial polyp.   No significant preoperative concerns.  History of Present Illness: Melody Norris is a 53 year old female who presents with abnormal uterine bleeding and a suspected uterine polyp.   She has been experiencing abnormal uterine bleeding for the past six months, characterized by heavy bleeding with blood clots and significant lower abdominal pain. Initially, her menstrual cycles were regular, lasting one to three days, but they have recently become prolonged, lasting up to a week. She also noted mucus with blood a couple of days ago.   An ultrasound was performed, and the patient was told it showed something suspicious for a uterine polyp. She had a pelvic ultrasound at a [facility] facility on 04/18/2024 that suggested and endometrial polyp.    Her past medical history includes treatment for anxiety and depression, managed with buspirone and sertraline, and asthma, managed with albuterol inhalers. She also has a history of a benign breast tumor removal.   Socially, she does not smoke but is exposed to secondhand smoke, does not use illegal drugs, and does not consume alcohol. She has no known drug allergies but is allergic to apples.   Last pap smear: 2 years ago and normal per patient.   Proposed surgery: Hysteroscopy, dilation and curettage, endometrial polypectomy  Past Medical History:  Diagnosis Date   Asthma    Breast lump    GERD (gastroesophageal reflux disease)    Seasonal allergies    Past Surgical History:  Procedure Laterality Date   BREAST BIOPSY Left 03/09/2019   clyindroma of skin   BREAST LUMPECTOMY WITH RADIOACTIVE SEED LOCALIZATION Left 04/11/2019   Procedure: breast lumpectomy with radioactive seed localization;  Surgeon: Desiderio Schanz, MD;  Location: [facility] ORS;  Service: General;  Laterality: Left;   OB History  Gravida  Para Term Preterm AB Living  5     5  SAB IAB Ectopic Multiple Live Births          # Outcome Date GA Lbr Len/2nd Weight Sex Type Anes PTL Lv  5 Gravida           4 Gravida           3 Gravida           2 Gravida           1 Gravida             Obstetric Comments  Menstrual age: 48  Age 1st Pregnancy: 59  Patient denies any other pertinent gynecologic issues.   No current facility-administered medications on file prior to encounter.   Current Outpatient Medications on File Prior to Encounter  Medication Sig Dispense Refill   albuterol (PROVENTIL) (2.5 MG/3ML) 0.083% nebulizer solution Take 2.5 mg by nebulization every 6 (six) hours as needed for wheezing or shortness of breath.     albuterol (VENTOLIN HFA) 108 (90 Base) MCG/ACT inhaler Inhale 1 puff into the lungs every 6 (six) hours as needed for wheezing or shortness of breath.     busPIRone (BUSPAR) 15 MG tablet Take 15 mg by mouth 2 (two) times daily.     fexofenadine (ALLEGRA) 180 MG tablet Take 180 mg by mouth daily.     fluticasone (FLONASE) 50 MCG/ACT nasal spray Place 2 sprays into both nostrils in the morning and at bedtime.     predniSONE (STERAPRED UNI-PAK 21 TAB) 5 MG (21) TBPK tablet  Take 5 mg by mouth as directed.     sertraline (ZOLOFT) 100 MG tablet Take 100 mg by mouth daily.     SYMBICORT 80-4.5 MCG/ACT inhaler Inhale 2 puffs into the lungs in the morning and at bedtime.     ibuprofen  (ADVIL ) 600 MG tablet Take 1 tablet (600 mg total) by mouth every 6 (six) hours as needed. (Patient not taking: Reported on 06/11/2024) 30 tablet 0   No Known Allergies  Social History:   reports that she has never smoked. She has never used smokeless tobacco. She reports that she does not drink alcohol and does not use drugs.  No family history on file.  Review of Systems: Noncontributory  PHYSICAL EXAM: There were no vitals taken for this visit. CONSTITUTIONAL: Well-developed, well-nourished female in no acute distress.   HENT:  Normocephalic, atraumatic, External right and left ear normal. Oropharynx is clear and moist EYES: Conjunctivae and EOM are normal. Pupils are equal, round, and reactive to light. No scleral icterus.  NECK: Normal range of motion, supple, no masses SKIN: Skin is warm and dry. No rash noted. Not diaphoretic. No erythema. No pallor. NEUROLGIC: Alert and oriented to person, place, and time. Normal reflexes, muscle tone coordination. No cranial nerve deficit noted. PSYCHIATRIC: Normal mood and affect. Normal behavior. Normal judgment and thought content. CARDIOVASCULAR: Normal heart rate noted, regular rhythm RESPIRATORY: Effort and breath sounds normal, no problems with respiration noted ABDOMEN: Soft, nontender, nondistended. PELVIC: Deferred MUSCULOSKELETAL: Normal range of motion. No edema and no tenderness. 2+ distal pulses.  Labs: No results found for this or any previous visit (from the past 2 weeks).  Imaging Studies: No results found.  Assessment: Abnormal uterine bleeding Endometrial polyp  Plan: Patient will undergo surgical management with the above-noted surgery.   The risks of surgery were discussed in detail with the patient including but not limited to: bleeding which may require transfusion or reoperation; infection which may require antibiotics; injury to surrounding organs which may involve bowel, bladder, ureters ; need for additional procedures including laparoscopy or laparotomy; thromboembolic phenomenon, surgical site problems and other postoperative/anesthesia complications. Likelihood of success in alleviating the patient's condition was discussed. Routine postoperative instructions will be reviewed with the patient and her family in detail after surgery.  The patient concurred with the proposed plan, giving informed written consent for the surgery.  Patient has been NPO since last night she will remain NPO for procedure.  Anesthesia and OR aware.  Preoperative  prophylactic antibiotics, as indicated, and SCDs ordered on call to the OR.  To OR when ready.  Garnette Mace, MD 06/12/2024 4:48 PM

## 2024-06-13 ENCOUNTER — Encounter
Admission: RE | Admit: 2024-06-13 | Discharge: 2024-06-13 | Disposition: A | Discharge status: Home / Self Care | Source: Ambulatory Visit | Attending: Obstetrics and Gynecology | Admitting: Obstetrics and Gynecology

## 2024-06-13 ENCOUNTER — Other Ambulatory Visit: Payer: Self-pay

## 2024-06-13 VITALS — Ht 64.0 in | Wt 240.0 lb

## 2024-06-13 DIAGNOSIS — Z01812 Encounter for preprocedural laboratory examination: Secondary | ICD-10-CM

## 2024-06-13 HISTORY — DX: Nerve root and plexus disorder, unspecified: G54.9

## 2024-06-13 HISTORY — DX: Radiculopathy, lumbar region: M54.16

## 2024-06-13 HISTORY — DX: Carpal tunnel syndrome, bilateral upper limbs: G56.03

## 2024-06-13 NOTE — Patient Instructions (Addendum)
 Su procedimiento est programado para: Miercoles 06/20/24 Presntese en el mostrador de Tax adviser del [employer] Para saber su hora de llegada, llame al [phone] entre la 1:00 p. m. y las 3:00 p. m. en: Martes 06/19/24 Si su hora de llegada es a las 6:00 am, no llegue antes de esa hora ya que las puertas de [place] del [facility] no se abren Teacher, adult education las 6:00 am.  RECORDAR: Las instrucciones que no se siguen completamente pueden provocar riesgos mdicos graves, que pueden llegar hasta la muerte; o, segn el criterio de su cirujano y Scientific laboratory technician, es posible que sea Aeronautical engineer su Leisure centre manager.  No ingiera alimentos despus de la medianoche del da anterior a la ciruga. No mascar chicle ni caramelos duros.  Sin embargo, puede beber lquidos [employer] 2 horas antes de la fecha prevista de llegada a la [place]. No beba nada dentro de las 2 horas anteriores a su hora de Nurse, learning disability.  Los lquidos claros incluyen: - agua - jugo de manzana sin pulpa - caf o t negro (NO agregue leche ni cremas al caf o t) NO beba nada que no est en esta lista.  Adems, su mdico le ha recetado que beba lo siguiente: Asegrese de beber una bebida clara con carbohidratos antes de la Midwife esta bebida con carbohidratos [employer] horas antes de la ciruga ayuda a reducir la resistencia a la insulina y [employer] de [place]. Termine de beber 2 horas antes de la hora de llegada programada.  Una semana antes de la ciruga: Detenga los antiinflamatorios (AINE) como Advil , Aleve, Ibuprofeno, Meloxicam, Mobic, Motrin , Naproxen, Naprosyn y productos a base de aspirina como Excedrin, Goody's Powder, BC Powder. Suspenda CUALQUIER suplemento de venta libre hasta despus de la ciruga. Sin embargo, puede Educational psychologist tomando Tylenol  si es necesario para Press photographer da de la [place]. Contine tomando todos los medicamentos recetados, excepto los  siguientes:   Siga las recomendaciones del cardilogo o PCP con respecto a suspender los anticoagulantes.  TOME SLO ESTOS MEDICAMENTOS LA MAANA DE LA CIRUGA CON UN SORBO DE AGUA:  busPIRone (BUSPAR) 15 MG  sertraline (ZOLOFT) 100 MG  predniSONE (STERAPRED UNI-PAK 21 TAB) 5 MG    Utilice inhaladores el da de la ciruga y llvelos al hospital. albuterol (VENTOLIN HFA) 108 (90 Base) MCG/ACT inhale  SYMBICORT 80-4.5 MCG/ACT inhaler    No consumir alcohol durante 24 horas antes o despus de la [place].  No fumar, incluidos los cigarrillos electrnicos, durante las 24 horas previas a la [place]. No consumir productos de tabaco masticables durante al menos 6 horas antes de la [place]. Sin parches de Optometrist de la [place].  No use ningn medicamento recreativo durante al menos una semana (preferiblemente 2 semanas) antes de la ciruga. Tenga en cuenta que la combinacin de cocana y anestesia puede tener resultados negativos, que pueden llegar hasta la [place]. Si su prueba de cocana da positivo, su ciruga ser cancelada.  La maana de la ciruga cepille sus dientes con pasta dental y agua, puede enjuagarse la boca con enjuague bucal si lo desea. No ingiera pasta de dientes ni enjuague bucal.  Tome una ducha la maana de su ciruga utilice jabon antibacterial.   No use joyas, maquillaje, horquillas, clips ni esmalte de uas.  No use lociones, polvos ni perfumes.  No se afeite el vello corporal desde el cuello hacia abajo 48 horas antes de la [place].  No se pueden usar  lentes de contacto, audfonos ni dentaduras postizas durante la [place].  No lleve objetos de valor al hospital. [facility] no es responsable de ninguna pertenencia u objeto de valor perdido o perdido.  Notifique a su mdico si hay algn cambio en su condicin mdica (resfriado, fiebre, infeccin).  Lleve ropa cmoda (especfica para su tipo de [place]) al hospital.  Despus de la ciruga, usted  puede ayudar a prevenir complicaciones pulmonares haciendo ejercicios de respiracin. Respire profundamente y tosa cada 1 o 2 horas. Su mdico puede indicarle un dispositivo llamado espirmetro incentivador para ayudarle a respirar profundamente. Al toser o Engineering geologist, sostenga firmemente una almohada contra la incisin con ambas manos. Esto se llama ferulizacin. Hacer esto ayuda a proteger su incisin. Tambin disminuye las molestias abdominales.  Si vas a pasar la noche en el hospital, deja tu maleta en el coche. Despus de la [place], es posible que lo lleven a su habitacin.  En caso de un mayor censo de [place], puede ser necesario que usted, el [place], contine con su atencin posoperatoria en el departamento de Ciruga el Mismo Da.  Si le dan el alta el da de la [place], no se le permitir conducir a casa. Necesitar que una persona responsable lo lleve a su casa y se quede con usted durante las 24 horas posteriores a la [place].  Si viaja en transporte pblico, deber ir acompaado de una persona responsable.  Llame al Departamento de pruebas previas a la admisin al [phone] si tiene alguna pregunta sobre estas instrucciones.  Poltica de visitas a ciruga:  [employer] se someten a [place] o procedimiento pueden [employer] familiares o personas de apoyo con ellos, siempre y cuando la persona no sea positiva para COVID-19 ni experimente sus sntomas.  Visitas para pacientes hospitalizados:  El horario de visita es de 7 a 20 horas. Se permiten hasta cuatro visitantes a la vez en la habitacin de un paciente. Los visitantes podrn rotar con Garment/textile technologist. Ignacia persona de apoyo designada (adulto) podr pasar la noche.  Debido a un aumento en las tasas de VSR e influenza y las hospitalizaciones asociadas, los nios menores de 12 aos no podrn visitar a los pacientes en los hospitales de [employer]. Se siguen recomendando encarecidamente las  mascarillas.  Your procedure is scheduled on: Wednesday 06/20/24 Report to the Registration Desk on the 1st floor of the [facility]. To find out your arrival time, please call [phone] between 1PM - 3PM on: Tuesday 06/19/24 If your arrival time is 6:00 am, do not arrive before that time as the [facility] Mall entrance doors do not open until 6:00 am.  REMEMBER: Instructions that are not followed completely may result in serious medical risk, up to and including death; or upon the discretion of your surgeon and anesthesiologist your surgery may need to be rescheduled.  Do not eat food after midnight the night before surgery.  No gum chewing or hard candies.  You may however, drink CLEAR liquids up to 2 hours before you are scheduled to arrive for your surgery. Do not drink anything within 2 hours of your scheduled arrival time.  Clear liquids include: - water  - apple juice without pulp - black coffee or tea (Do NOT add milk or creamers to the coffee or tea) Do NOT drink anything that is not on this list.   In addition, your doctor has ordered for you to drink the provided:  Ensure Pre-Surgery Clear Carbohydrate Drink  Drinking this carbohydrate drink up to two hours before surgery helps to reduce insulin resistance and improve patient outcomes. Please complete drinking 2 hours before scheduled arrival time.  One week prior to surgery: Stop Anti-inflammatories (NSAIDS) such as Advil , Aleve, Ibuprofen , Motrin , Naproxen, Naprosyn and Aspirin based products such as Excedrin, Goody's Powder, BC Powder. Stop ANY OVER THE COUNTER supplements until after surgery.  You may however, continue to take Tylenol  if needed for pain up until the day of surgery.  Continue taking all of your other prescription medications up until the day of surgery.  ON THE DAY OF SURGERY ONLY TAKE THESE MEDICATIONS WITH SIPS OF WATER:  busPIRone (BUSPAR) 15 MG  sertraline (ZOLOFT) 100 MG  predniSONE  (STERAPRED UNI-PAK 21 TAB) 5 MG   Use inhalers on the day of surgery and bring to the hospital. albuterol (VENTOLIN HFA) 108 (90 Base) MCG/ACT inhaler  SYMBICORT 80-4.5 MCG/ACT inhaler    No Alcohol for 24 hours before or after surgery.  No Smoking including e-cigarettes for 24 hours before surgery.  No chewable tobacco products for at least 6 hours before surgery.  No nicotine patches on the day of surgery.  Do not use any recreational drugs for at least a week (preferably 2 weeks) before your surgery.  Please be advised that the combination of cocaine and anesthesia may have negative outcomes, up to and including death. If you test positive for cocaine, your surgery will be cancelled.  On the morning of surgery brush your teeth with toothpaste and water, you may rinse your mouth with mouthwash if you wish. Do not swallow any toothpaste or mouthwash.  Shower the morning of your surgery, using antibacterial soap.   Do not wear jewelry, make-up, hairpins, clips or nail polish.  Do not wear lotions, powders, or perfumes.   Do not shave body hair from the neck down 48 hours before surgery.  Contact lenses, hearing aids and dentures may not be worn into surgery.  Do not bring valuables to the hospital. [facility] is not responsible for any missing/lost belongings or valuables.    Notify your doctor if there is any change in your medical condition (cold, fever, infection).  Wear comfortable clothing (specific to your surgery type) to the hospital.  After surgery, you can help prevent lung complications by doing breathing exercises.  Take deep breaths and cough every 1-2 hours. Your doctor may order a device called an Incentive Spirometer to help you take deep breaths. When coughing or sneezing, hold a pillow firmly against your incision with both hands. This is called "splinting." Doing this helps protect your incision. It also decreases belly discomfort.  If you are being  admitted to the hospital overnight, leave your suitcase in the car. After surgery it may be brought to your room.  In case of increased patient census, it may be necessary for you, the patient, to continue your postoperative care in the Same Day Surgery department.  If you are being discharged the day of surgery, you will not be allowed to drive home. You will need a responsible individual to drive you home and stay with you for 24 hours after surgery.   If you are taking public transportation, you will need to have a responsible individual with you.  Please call the Pre-admissions Testing Dept. at [phone] if you have any questions about these instructions.  Surgery Visitation Policy:  Patients having surgery or a procedure may have two visitors.  Children under the age of  16 must have an adult with them who is not the patient.  Inpatient Visitation:    Visiting hours are 7 a.m. to 8 p.m. Up to four visitors are allowed at one time in a patient room. The visitors may rotate out with other people during the day.  One visitor age 70 or older may stay with the patient overnight and must be in the room by 8 p.m.   Merchandiser, retail to address health-related social needs:  [email or web address]

## 2024-06-20 ENCOUNTER — Other Ambulatory Visit: Payer: Self-pay

## 2024-06-20 ENCOUNTER — Encounter: Payer: Self-pay | Admitting: Obstetrics and Gynecology

## 2024-06-20 ENCOUNTER — Encounter
Admission: RE | Disposition: A | Discharge status: Home / Self Care | Payer: Self-pay | Source: Home / Self Care | Attending: Obstetrics and Gynecology

## 2024-06-20 ENCOUNTER — Ambulatory Visit: Admitting: Anesthesiology

## 2024-06-20 ENCOUNTER — Ambulatory Visit: Payer: Self-pay | Admitting: Urgent Care

## 2024-06-20 ENCOUNTER — Ambulatory Visit
Admission: RE | Admit: 2024-06-20 | Discharge: 2024-06-20 | Disposition: A | Discharge status: Home / Self Care | Attending: Obstetrics and Gynecology | Admitting: Obstetrics and Gynecology

## 2024-06-20 DIAGNOSIS — N840 Polyp of corpus uteri: Secondary | ICD-10-CM | POA: Insufficient documentation

## 2024-06-20 DIAGNOSIS — F32A Depression, unspecified: Secondary | ICD-10-CM | POA: Insufficient documentation

## 2024-06-20 DIAGNOSIS — Z7722 Contact with and (suspected) exposure to environmental tobacco smoke (acute) (chronic): Secondary | ICD-10-CM | POA: Insufficient documentation

## 2024-06-20 DIAGNOSIS — N921 Excessive and frequent menstruation with irregular cycle: Secondary | ICD-10-CM | POA: Diagnosis present

## 2024-06-20 DIAGNOSIS — F419 Anxiety disorder, unspecified: Secondary | ICD-10-CM | POA: Diagnosis not present

## 2024-06-20 DIAGNOSIS — Z01812 Encounter for preprocedural laboratory examination: Secondary | ICD-10-CM

## 2024-06-20 DIAGNOSIS — Z79899 Other long term (current) drug therapy: Secondary | ICD-10-CM | POA: Insufficient documentation

## 2024-06-20 HISTORY — PX: CERVICAL POLYPECTOMY: SHX88

## 2024-06-20 HISTORY — PX: HYSTEROSCOPY WITH D & C: SHX1775

## 2024-06-20 LAB — POCT PREGNANCY, URINE: Preg Test, Ur: NEGATIVE

## 2024-06-20 SURGERY — DILATATION AND CURETTAGE /HYSTEROSCOPY
Anesthesia: General | Site: Uterus

## 2024-06-20 MED ORDER — MIDAZOLAM HCL 2 MG/2ML IJ SOLN
INTRAMUSCULAR | Status: AC
  Filled 2024-06-20: qty 2

## 2024-06-20 MED ORDER — PROPOFOL 10 MG/ML IV BOLUS
INTRAVENOUS | Status: AC
  Filled 2024-06-20: qty 20

## 2024-06-20 MED ORDER — TRAMADOL HCL 50 MG PO TABS: 50.0000 mg | ORAL_TABLET | Freq: Two times a day (BID) | ORAL | 0 refills | Status: DC | PRN

## 2024-06-20 MED ORDER — MIDAZOLAM HCL (PF) 2 MG/2ML IJ SOLN
INTRAMUSCULAR | Status: DC | PRN
  Administered 2024-06-20: 2 mg via INTRAVENOUS

## 2024-06-20 MED ORDER — KETOROLAC TROMETHAMINE 30 MG/ML IJ SOLN
INTRAMUSCULAR | Status: DC | PRN
Start: 2024-06-20 — End: 2024-06-20
  Administered 2024-06-20: 30 mg via INTRAVENOUS

## 2024-06-20 MED ORDER — LIDOCAINE HCL (CARDIAC) PF 100 MG/5ML IV SOSY
PREFILLED_SYRINGE | INTRAVENOUS | Status: DC | PRN
  Administered 2024-06-20: 100 mg via INTRAVENOUS

## 2024-06-20 MED ORDER — LACTATED RINGERS IV SOLN: INTRAVENOUS | Status: DC

## 2024-06-20 MED ORDER — PROPOFOL 10 MG/ML IV BOLUS
INTRAVENOUS | Status: DC | PRN
  Administered 2024-06-20: 200 mg via INTRAVENOUS

## 2024-06-20 MED ORDER — OXYCODONE HCL 5 MG/5ML PO SOLN: 5.0000 mg | Freq: Once | ORAL | Status: DC | PRN

## 2024-06-20 MED ORDER — ACETAMINOPHEN 10 MG/ML IV SOLN: 1000.0000 mg | Freq: Once | INTRAVENOUS | Status: DC | PRN

## 2024-06-20 MED ORDER — CHLORHEXIDINE GLUCONATE 0.12 % MT SOLN
15.0000 mL | Freq: Once | OROMUCOSAL | Status: AC
  Administered 2024-06-20: 15 mL via OROMUCOSAL

## 2024-06-20 MED ORDER — CHLORHEXIDINE GLUCONATE 0.12 % MT SOLN
OROMUCOSAL | Status: AC
  Filled 2024-06-20: qty 15

## 2024-06-20 MED ORDER — FENTANYL CITRATE (PF) 100 MCG/2ML IJ SOLN: 25.0000 ug | INTRAMUSCULAR | Status: DC | PRN

## 2024-06-20 MED ORDER — DEXAMETHASONE SOD PHOSPHATE PF 10 MG/ML IJ SOLN
INTRAMUSCULAR | Status: DC | PRN
  Administered 2024-06-20: 10 mg via INTRAVENOUS

## 2024-06-20 MED ORDER — FENTANYL CITRATE (PF) 100 MCG/2ML IJ SOLN
INTRAMUSCULAR | Status: DC | PRN
  Administered 2024-06-20 (×2): 50 ug via INTRAVENOUS

## 2024-06-20 MED ORDER — FENTANYL CITRATE (PF) 100 MCG/2ML IJ SOLN
INTRAMUSCULAR | Status: AC
  Filled 2024-06-20: qty 2

## 2024-06-20 MED ORDER — ONDANSETRON HCL 4 MG/2ML IJ SOLN
INTRAMUSCULAR | Status: DC | PRN
  Administered 2024-06-20: 4 mg via INTRAVENOUS

## 2024-06-20 MED ORDER — KETOROLAC TROMETHAMINE 30 MG/ML IJ SOLN
INTRAMUSCULAR | Status: AC
Start: 2024-06-20 — End: 2024-06-20
  Filled 2024-06-20: qty 1

## 2024-06-20 MED ORDER — SODIUM CHLORIDE 0.9 % IR SOLN
Status: DC | PRN
  Administered 2024-06-20: 3000 mL

## 2024-06-20 MED ORDER — DROPERIDOL 2.5 MG/ML IJ SOLN: 0.6250 mg | Freq: Once | INTRAMUSCULAR | Status: DC | PRN

## 2024-06-20 MED ORDER — SILVER NITRATE-POT NITRATE 75-25 % EX MISC
CUTANEOUS | Status: DC | PRN
  Administered 2024-06-20 (×3): 2

## 2024-06-20 MED ORDER — 0.9 % SODIUM CHLORIDE (POUR BTL) OPTIME
TOPICAL | Status: DC | PRN
Start: 2024-06-20 — End: 2024-06-20
  Administered 2024-06-20: 500 mL

## 2024-06-20 MED ORDER — IBUPROFEN 600 MG PO TABS: 600.0000 mg | ORAL_TABLET | Freq: Four times a day (QID) | ORAL | 0 refills | Status: AC

## 2024-06-20 MED ORDER — ORAL CARE MOUTH RINSE: 15.0000 mL | Freq: Once | OROMUCOSAL | Status: AC

## 2024-06-20 MED ORDER — OXYCODONE HCL 5 MG PO TABS: 5.0000 mg | ORAL_TABLET | Freq: Once | ORAL | Status: DC | PRN

## 2024-06-20 SURGICAL SUPPLY — 25 items
BAG URINE DRAIN 2000ML AR STRL (UROLOGICAL SUPPLIES) ×2 IMPLANT
BASIN KIT SINGLE STR (MISCELLANEOUS) ×2 IMPLANT
CATH URTH 16FR FL 2W BLN LF (CATHETERS) ×2 IMPLANT
DEVICE MYOSURE REACH (MISCELLANEOUS) IMPLANT
DRSG TELFA 3X8 NADH STRL (GAUZE/BANDAGES/DRESSINGS) IMPLANT
ELECTRODE REM PT RTRN 9FT ADLT (ELECTROSURGICAL) ×2 IMPLANT
GLOVE BIO SURGEON STRL SZ7 (GLOVE) ×2 IMPLANT
GLOVE BIOGEL PI IND STRL 7.5 (GLOVE) ×2 IMPLANT
GOWN STRL REUS W/ TWL LRG LVL3 (GOWN DISPOSABLE) ×4 IMPLANT
KIT PROCED FLUENT PRO FLT212S (KITS) ×2 IMPLANT
KIT TURNOVER CYSTO (KITS) ×2 IMPLANT
MANIFOLD NEPTUNE II (INSTRUMENTS) ×2 IMPLANT
NS IRRIG 500ML POUR BTL (IV SOLUTION) IMPLANT
PACK DNC HYST (MISCELLANEOUS) ×2 IMPLANT
PAD OB MATERNITY 11 LF (PERSONAL CARE ITEMS) ×2 IMPLANT
PAD PREP OB/GYN DISP 24X41 (PERSONAL CARE ITEMS) ×2 IMPLANT
SCRUB CHG 4% DYNA-HEX 4OZ (MISCELLANEOUS) ×2 IMPLANT
SEAL ROD LENS SCOPE MYOSURE (ABLATOR) ×2 IMPLANT
SET CYSTO IRRIGATION (SET/KITS/TRAYS/PACK) IMPLANT
SOL .9 NS 3000ML IRR UROMATIC (IV SOLUTION) ×2 IMPLANT
SOL PREP POV-IOD 4OZ 10% (MISCELLANEOUS) IMPLANT
SURGILUBE 2OZ TUBE FLIPTOP (MISCELLANEOUS) ×2 IMPLANT
SYR 10ML LL (SYRINGE) ×2 IMPLANT
TRAP FLUID SMOKE EVACUATOR (MISCELLANEOUS) ×2 IMPLANT
TUBING CONNECTING 10 (TUBING) ×2 IMPLANT

## 2024-06-20 NOTE — Anesthesia Procedure Notes (Signed)
 Procedure Name: LMA Insertion Date/Time: 06/20/2024 7:37 AM  Performed by: Belinda, Suheyla Mortellaro, CRNAPre-anesthesia Checklist: Patient identified, Emergency Drugs available, Suction available and Patient being monitored Patient Re-evaluated:Patient Re-evaluated prior to induction Oxygen Delivery Method: Circle system utilized Preoxygenation: Pre-oxygenation with 100% oxygen Induction Type: IV induction Ventilation: Mask ventilation without difficulty LMA: LMA inserted LMA Size: 3.0 Number of attempts: 1 Placement Confirmation: positive ETCO2 and breath sounds checked- equal and bilateral Tube secured with: Tape Dental Injury: Teeth and Oropharynx as per pre-operative assessment

## 2024-06-20 NOTE — Anesthesia Preprocedure Evaluation (Addendum)
 Anesthesia Evaluation  Patient identified by MRN, date of birth, ID band Patient awake    Reviewed: Allergy & Precautions, H&P , NPO status , Patient's Chart, lab work & pertinent test results  Airway Mallampati: II  TM Distance: >3 FB Neck ROM: full    Dental no notable dental hx.    Pulmonary asthma    Pulmonary exam normal        Cardiovascular negative cardio ROS Normal cardiovascular exam     Neuro/Psych  Neuromuscular disease  negative psych ROS   GI/Hepatic negative GI ROS, Neg liver ROS,,,  Endo/Other    Class 3 obesity  Renal/GU      Musculoskeletal   Abdominal  (+) + obese  Peds  Hematology negative hematology ROS (+)   Anesthesia Other Findings abnormal uterine bleeding, endometrial polyp  Past Medical History: No date: Asthma No date: Bilateral carpal tunnel syndrome No date: Breast lump No date: GERD (gastroesophageal reflux disease) No date: Lumbar radiculopathy No date: Nerve root disorder No date: Seasonal allergies  Past Surgical History: 03/09/2019: BREAST BIOPSY; Left     Comment:  clyindroma of skin 04/11/2019: BREAST LUMPECTOMY WITH RADIOACTIVE SEED LOCALIZATION; Left     Comment:  Procedure: breast lumpectomy with radioactive seed               localization;  Surgeon: Desiderio Schanz, MD;  Location:               [facility];  Service: General;  Laterality: Left;  BMI    Body Mass Index: 40.87 kg/m      Reproductive/Obstetrics negative OB ROS                              Anesthesia Physical Anesthesia Plan  ASA: 3  Anesthesia Plan: General ETT   Post-op Pain Management: Ofirmev  IV (intra-op)* and Toradol IV (intra-op)*   Induction: Intravenous  PONV Risk Score and Plan: 2 and Ondansetron , Dexamethasone  and Midazolam   Airway Management Planned: LMA and Oral ETT  Additional Equipment:   Intra-op Plan:   Post-operative Plan: Extubation in  OR  Informed Consent: I have reviewed the patients History and Physical, chart, labs and discussed the procedure including the risks, benefits and alternatives for the proposed anesthesia with the patient or authorized representative who has indicated his/her understanding and acceptance.     Dental Advisory Given  Plan Discussed with: CRNA and Surgeon  Anesthesia Plan Comments:          Anesthesia Quick Evaluation

## 2024-06-20 NOTE — Op Note (Signed)
 Operative Note   Name: Melody Norris  Date of Service: 06/20/2024  DOB: [birth year]  MRN: [id number]   PRE-OP DIAGNOSIS:  1) Menorrhagia with irregular cycle 2) Endometrial polyp   POST-OP DIAGNOSIS:  1) Menorrhagia with irregular cycle 2) Endometrial polyp   SURGEON: Surgeons and Role:    DEWAINE Leonce Garnette JONETTA, MD - Primary  PROCEDURE: Procedure(s): DILATATION AND CURETTAGE /HYSTEROSCOPY POLYPECTOMY, ENDOMETRIAL   ANESTHESIA: General   ESTIMATED BLOOD LOSS: 10 mL  DRAINS: none   TOTAL IV FLUIDS: 600 mL  SPECIMENS:  Endometrial polyp and curettings  VTE PROPHYLAXIS: SCDs to the bilateral lower extremities  ANTIBIOTICS: none indicated  FLUID DEFICIT: 105 mL  COMPLICATIONS: none  DISPOSITION: PACU - hemodynamically stable.  CONDITION: stable  INDICATION: 53 year old female with irregular and heavy bleeding who had a pelvic ultrasound that showed a 1.1 cm lower uterine segment versus cervical.    FINDINGS: Exam under anesthesia revealed small, mobile anteverted uterus with no masses and bilateral adnexa without masses or fullness. Hysteroscopy revealed a right lateral ~1cm polypoid lesion in the lower uterine segment, otherwise a grossly normal appearing uterine cavity with bilateral tubal ostia and normal appearing endocervical canal.  PROCEDURE IN DETAIL:  After informed consent was obtained, the patient was taken to the operating room where anesthesia was obtained without difficulty. The patient was positioned in the dorsal lithotomy position in Talpa stirrups.  The patient's bladder was catheterized with an in and out foley catheter.  The patient was examined under anesthesia, with the above noted findings.  The bi-valved speculum was placed inside the patient's vagina, and the the anterior lip of the cervix was grasped with the tenaculum.  Then the cervix was progressively dilated to a 7 mm Hegar dilator.  The hysteroscope was introduced, with the above noted  findings.  The Reach MyoSure device was inserted through the hysteroscope and the polypoid lesion was removed entirely. A gentle sampling of the lining of the uterine was taken.  The hystersocope was removed and the uterine cavity was curetted until a gritty texture was noted, yielding moderate endometrial curettings.  Excellent hemostasis was noted,, and all instruments were removed, with no cervical bleeding noted and the tenaculum entry sites made hemostatic using silver nitrate .  She was then taken out of dorsal lithotomy.  The patient tolerated the procedure well.  Sponge, lap and needle counts were correct x2.  The patient was taken to recovery room in excellent condition.  Garnette CHARM Leonce, MD, FACOG 06/20/2024 8:23 AM

## 2024-06-20 NOTE — Interval H&P Note (Signed)
 History and Physical Interval Note:  06/20/2024 7:14 AM  Melody Norris  has presented today for surgery, with the diagnosis of menorrhagia with irregular cycle endometrial polyp.  The various methods of treatment have been discussed with the patient and family. After consideration of risks, benefits and other options for treatment, the patient has consented to  Procedure(s): DILATATION AND CURETTAGE /HYSTEROSCOPY (N/A) POLYPECTOMY, CERVIX (N/A) as a surgical intervention.  The patient's history has been reviewed, patient examined, no change in status, stable for surgery.  I have reviewed the patient's chart and labs.  Questions were answered to the patient's satisfaction.    Garnette Mace, MD, [facility] 06/20/2024 7:14 AM

## 2024-06-20 NOTE — Transfer of Care (Signed)
 Immediate Anesthesia Transfer of Care Note  Patient: Melody Norris  Procedure(s) Performed: DILATATION AND CURETTAGE /HYSTEROSCOPY (Uterus) POLYPECTOMY, CERVIX (Cervix)  Patient Location: PACU  Anesthesia Type:General  Level of Consciousness: drowsy  Airway & Oxygen Therapy: Patient Spontanous Breathing and Patient connected to face mask oxygen  Post-op Assessment: Report given to RN and Post -op Vital signs reviewed and stable  Post vital signs: Reviewed and stable  Last Vitals:  Vitals Value Taken Time  BP 127/55 06/20/24 08:23  Temp    Pulse 85 06/20/24 08:25  Resp 14 06/20/24 08:25  SpO2 98 % 06/20/24 08:25  Vitals shown include unfiled device data.  Last Pain:  Vitals:   06/20/24 0630  TempSrc: Temporal  PainSc: 0-No pain         Complications: No notable events documented.

## 2024-06-21 ENCOUNTER — Encounter: Payer: Self-pay | Admitting: Obstetrics and Gynecology

## 2024-06-21 LAB — SURGICAL PATHOLOGY

## 2024-06-21 NOTE — Anesthesia Postprocedure Evaluation (Signed)
 Anesthesia Post Note  Patient: Melody Norris  Procedure(s) Performed: DILATATION AND CURETTAGE /HYSTEROSCOPY (Uterus) POLYPECTOMY, CERVIX (Cervix)  Patient location during evaluation: PACU Anesthesia Type: General Level of consciousness: awake and alert Pain management: pain level controlled Vital Signs Assessment: post-procedure vital signs reviewed and stable Respiratory status: spontaneous breathing, nonlabored ventilation and respiratory function stable Cardiovascular status: blood pressure returned to baseline and stable Postop Assessment: no apparent nausea or vomiting Anesthetic complications: no   No notable events documented.   Last Vitals:  Vitals:   06/20/24 0900 06/20/24 0910  BP: (!) 111/58 138/73  Pulse: 83 78  Resp: 13 16  Temp: (!) 36.2 C (!) 36.3 C  SpO2: 93% 96%    Last Pain:  Vitals:   06/20/24 0910  TempSrc: Temporal  PainSc: 0-No pain                 Camellia Merilee Louder

## 2024-08-20 ENCOUNTER — Emergency Department
Admission: EM | Admit: 2024-08-20 | Discharge: 2024-08-20 | Disposition: A | Discharge status: Home / Self Care | Attending: Emergency Medicine | Admitting: Emergency Medicine

## 2024-08-20 ENCOUNTER — Emergency Department

## 2024-08-20 DIAGNOSIS — M79602 Pain in left arm: Secondary | ICD-10-CM | POA: Diagnosis present

## 2024-08-20 DIAGNOSIS — M7522 Bicipital tendinitis, left shoulder: Secondary | ICD-10-CM | POA: Insufficient documentation

## 2024-08-20 MED ORDER — BACLOFEN 10 MG PO TABS: 10.0000 mg | ORAL_TABLET | Freq: Three times a day (TID) | ORAL | 0 refills | Status: AC

## 2024-08-20 MED ORDER — MELOXICAM 15 MG PO TABS: 15.0000 mg | ORAL_TABLET | Freq: Every day | ORAL | 0 refills | Status: AC

## 2024-08-20 MED ORDER — KETOROLAC TROMETHAMINE 15 MG/ML IJ SOLN
15.0000 mg | Freq: Once | INTRAMUSCULAR | Status: AC
  Administered 2024-08-20: 15 mg via INTRAMUSCULAR
  Filled 2024-08-20: qty 1

## 2024-08-20 MED ORDER — TRAMADOL HCL 50 MG PO TABS: 50.0000 mg | ORAL_TABLET | Freq: Four times a day (QID) | ORAL | 0 refills | Status: AC | PRN

## 2024-08-20 MED ORDER — OXYCODONE-ACETAMINOPHEN 5-325 MG PO TABS
1.0000 | ORAL_TABLET | Freq: Once | ORAL | Status: AC
  Administered 2024-08-20: 1 via ORAL
  Filled 2024-08-20 (×2): qty 1
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
## 2024-08-20 NOTE — Discharge Instructions (Signed)
 Follow-up with your regular doctor as needed.  Follow-up with orthopedic.  Please call them for an appointment.  Take medications as prescribed.  Apply ice to the area

## 2024-08-20 NOTE — ED Notes (Signed)
 Pt refused shoulder sling. She states she has one at home.
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
